# Patient Record
Sex: Male | Born: 1945 | Race: Black or African American | Hispanic: No | Marital: Married | Smoking: Never smoker
Health system: Southern US, Community
[De-identification: ages and names within clinical notes are randomized; demographics above are authoritative.]

## PROBLEM LIST (undated history)

## (undated) DIAGNOSIS — E119 Type 2 diabetes mellitus without complications: Secondary | ICD-10-CM

## (undated) DIAGNOSIS — I10 Essential (primary) hypertension: Secondary | ICD-10-CM

## (undated) HISTORY — PX: TONSILLECTOMY: SUR1361

---

## 2015-02-14 ENCOUNTER — Emergency Department: Payer: Medicare Other

## 2015-02-14 ENCOUNTER — Encounter: Payer: Self-pay | Admitting: *Deleted

## 2015-02-14 ENCOUNTER — Emergency Department
Admission: EM | Admit: 2015-02-14 | Discharge: 2015-02-17 | Disposition: A | Discharge status: Home / Self Care | Payer: Medicare Other | Attending: Student | Admitting: Student

## 2015-02-14 DIAGNOSIS — I10 Essential (primary) hypertension: Secondary | ICD-10-CM | POA: Insufficient documentation

## 2015-02-14 DIAGNOSIS — F0280 Dementia in other diseases classified elsewhere without behavioral disturbance: Secondary | ICD-10-CM

## 2015-02-14 DIAGNOSIS — G309 Alzheimer's disease, unspecified: Secondary | ICD-10-CM

## 2015-02-14 DIAGNOSIS — E1165 Type 2 diabetes mellitus with hyperglycemia: Secondary | ICD-10-CM | POA: Insufficient documentation

## 2015-02-14 DIAGNOSIS — F0391 Unspecified dementia with behavioral disturbance: Secondary | ICD-10-CM | POA: Insufficient documentation

## 2015-02-14 DIAGNOSIS — Z5900 Homelessness unspecified: Secondary | ICD-10-CM

## 2015-02-14 DIAGNOSIS — R339 Retention of urine, unspecified: Secondary | ICD-10-CM | POA: Diagnosis not present

## 2015-02-14 DIAGNOSIS — E119 Type 2 diabetes mellitus without complications: Secondary | ICD-10-CM

## 2015-02-14 DIAGNOSIS — R338 Other retention of urine: Secondary | ICD-10-CM

## 2015-02-14 DIAGNOSIS — R319 Hematuria, unspecified: Secondary | ICD-10-CM | POA: Diagnosis present

## 2015-02-14 HISTORY — DX: Essential (primary) hypertension: I10

## 2015-02-14 HISTORY — DX: Type 2 diabetes mellitus without complications: E11.9

## 2015-02-14 LAB — COMPREHENSIVE METABOLIC PANEL
ALK PHOS: 92 U/L (ref 38–126)
ALT: 25 U/L (ref 17–63)
AST: 23 U/L (ref 15–41)
Albumin: 4.4 g/dL (ref 3.5–5.0)
Anion gap: 12 (ref 5–15)
BUN: 19 mg/dL (ref 6–20)
CHLORIDE: 101 mmol/L (ref 101–111)
CO2: 22 mmol/L (ref 22–32)
Calcium: 9.7 mg/dL (ref 8.9–10.3)
Creatinine, Ser: 1.33 mg/dL — ABNORMAL HIGH (ref 0.61–1.24)
GFR calc Af Amer: >60 mL/min (ref >60)
GFR, EST NON AFRICAN AMERICAN: 53 mL/min — AB (ref >60)
Glucose, Bld: 354 mg/dL — ABNORMAL HIGH (ref 65–99)
POTASSIUM: 3.7 mmol/L (ref 3.5–5.1)
SODIUM: 135 mmol/L (ref 135–145)
Total Bilirubin: 0.8 mg/dL (ref 0.3–1.2)
Total Protein: 8.3 g/dL — ABNORMAL HIGH (ref 6.5–8.1)

## 2015-02-14 LAB — CBC
HCT: 40.5 % (ref 40.0–52.0)
Hemoglobin: 13.3 g/dL (ref 13.0–18.0)
MCH: 29.1 pg (ref 26.0–34.0)
MCHC: 32.9 g/dL (ref 32.0–36.0)
MCV: 88.5 fL (ref 80.0–100.0)
PLATELETS: 237 10*3/uL (ref 150–440)
RBC: 4.57 MIL/uL (ref 4.40–5.90)
RDW: 13.3 % (ref 11.5–14.5)
WBC: 12.2 10*3/uL — ABNORMAL HIGH (ref 3.8–10.6)

## 2015-02-14 LAB — URINALYSIS COMPLETE WITH MICROSCOPIC (ARMC ONLY)
Bacteria, UA: NONE SEEN
Bilirubin Urine: NEGATIVE
Glucose, UA: >500 mg/dL — AB
LEUKOCYTES UA: NEGATIVE
NITRITE: NEGATIVE
PH: 5 (ref 5.0–8.0)
PROTEIN: 30 mg/dL — AB
Specific Gravity, Urine: 1.027 (ref 1.005–1.030)
Squamous Epithelial / LPF: NONE SEEN

## 2015-02-14 LAB — GLUCOSE, CAPILLARY
Glucose-Capillary: 314 mg/dL — ABNORMAL HIGH (ref 70–99)
Glucose-Capillary: 327 mg/dL — ABNORMAL HIGH (ref 70–99)
Glucose-Capillary: 340 mg/dL — ABNORMAL HIGH (ref 70–99)

## 2015-02-14 MED ORDER — METFORMIN HCL 500 MG PO TABS
500.0000 mg | ORAL_TABLET | Freq: Two times a day (BID) | ORAL | Status: DC
  Administered 2015-02-15 – 2015-02-17 (×6): 500 mg via ORAL

## 2015-02-14 MED ORDER — INSULIN ASPART 100 UNIT/ML ~~LOC~~ SOLN
SUBCUTANEOUS | Status: AC
  Administered 2015-02-14: 8 [IU] via SUBCUTANEOUS
  Filled 2015-02-14: qty 8

## 2015-02-14 MED ORDER — LIDOCAINE HCL 2 % EX GEL
1.0000 "application " | Freq: Once | CUTANEOUS | Status: AC
  Administered 2015-02-14: 1 via URETHRAL

## 2015-02-14 MED ORDER — INSULIN ASPART 100 UNIT/ML ~~LOC~~ SOLN
8.0000 [IU] | Freq: Once | SUBCUTANEOUS | Status: AC
  Administered 2015-02-14: 8 [IU] via SUBCUTANEOUS

## 2015-02-14 MED ORDER — METFORMIN HCL 500 MG PO TABS
ORAL_TABLET | ORAL | Status: AC
  Administered 2015-02-14: 1000 mg via ORAL
  Filled 2015-02-14: qty 2

## 2015-02-14 MED ORDER — INSULIN ASPART 100 UNIT/ML ~~LOC~~ SOLN
6.0000 [IU] | Freq: Once | SUBCUTANEOUS | Status: AC
  Administered 2015-02-14: 6 [IU] via SUBCUTANEOUS

## 2015-02-14 MED ORDER — INSULIN ASPART 100 UNIT/ML ~~LOC~~ SOLN
SUBCUTANEOUS | Status: AC
  Administered 2015-02-14: 6 [IU] via SUBCUTANEOUS
  Filled 2015-02-14: qty 6

## 2015-02-14 MED ORDER — METFORMIN HCL 500 MG PO TABS
1000.0000 mg | ORAL_TABLET | Freq: Once | ORAL | Status: AC
  Administered 2015-02-14: 1000 mg via ORAL

## 2015-02-14 MED ORDER — CIPROFLOXACIN HCL 500 MG PO TABS
250.0000 mg | ORAL_TABLET | Freq: Every morning | ORAL | Status: DC
  Administered 2015-02-15 – 2015-02-17 (×3): 250 mg via ORAL

## 2015-02-14 MED ORDER — LIDOCAINE HCL 2 % EX GEL
CUTANEOUS | Status: AC
  Administered 2015-02-14: 1 via URETHRAL
  Filled 2015-02-14: qty 10

## 2015-02-14 NOTE — ED Notes (Signed)
Pt to CT via stretcher

## 2015-02-14 NOTE — ED Notes (Signed)
Returned to room from CT; warm blankets given to pt; foley noted draining clear yellow urine; pt denies any c/o at present; FSBS performed as ordered & pt informed will recheck hourly until stable; pt voices good understanding

## 2015-02-14 NOTE — ED Provider Notes (Signed)
[facility] Emergency Department Provider Note  ____________________________________________  Time seen: Approximately 5:51 PM  I have reviewed the triage vital signs and the nursing notes.   HISTORY  Chief Complaint Hematuria    HPI Brandon Turner is a 69 y.o. male who presents after pulling out his Foley catheter. Per the patient and his sister he had a "swollen" prostate about one week ago and had to have a Foley catheter placed at [facility]. Today the patient pulled his catheter out which caused penile pain and some bleeding which is improving now. However he states he feels the urge to urinate.  Patient also reports that [place] [facility] [place] about one week ago and did not bring any medications with him. He states he has not taken his medicines for diabetes for quite some time, a little difficult to determine exactly how long.  Patient denies fevers or chills. He does feel a urge to urinate in the lower abdomen. She describes it as moderate in intensity. He is on a recent surgeries. He takes no medications, though he is supposed to be on some.  Per the patient's sister, he has been having issues with being homeless and living with his brother who also has some form of dementia. Patient's sister reports the patient is also thought to have some mild dementia.  Family is concerned that the patient is not doing well on his own at present time. (I can certainly share in her sediment at this point.)  History and review of systems is somewhat limited and the patient has poor recollection of events.  Particular the patient is unable to recall any of his medications. He is unable to give me further details of his past medical history.  Past Medical History  Diagnosis Date  . Diabetes mellitus without complication   . Hypertension     There are no active problems to display for this patient.   Past Surgical History  Procedure Laterality  Date  . Tonsillectomy      No current outpatient prescriptions on file.  Allergies Codeine  No family history on file.  Social History History  Substance Use Topics  . Smoking status: Never Smoker   . Smokeless tobacco: Never Used  . Alcohol Use: No    Review of Systems Constitutional: No fever/chills Eyes: No visual changes. ENT: No sore throat. Cardiovascular: Denies chest pain. Respiratory: Denies shortness of breath. Gastrointestinal: No abdominal pain.  No nausea, no vomiting.  No diarrhea.  No constipation. Genitourinary: See history of present illness Musculoskeletal: Negative for back pain. Skin: Negative for rash. Neurological: Negative for headaches, focal weakness or numbness.  10-point ROS otherwise negative.  ____________________________________________   PHYSICAL EXAM:  VITAL SIGNS: ED Triage Vitals  Enc Vitals Group     BP 02/14/15 1518 154/73 mmHg     Pulse Rate 02/14/15 1518 94     Resp --      Temp --      Temp Source 02/14/15 1518 Oral     SpO2 02/14/15 1518 96 %     Weight 02/14/15 1518 260 lb (117.935 kg)     Height 02/14/15 1518 5\' 9"  (1.753 m)     Head Cir --      Peak Flow --      Pain Score 02/14/15 1519 10     Pain Loc --      Pain Edu? --      Excl. in GC? --  Constitutional: Alert and oriented to place, situation, and month. The patient is disoriented to year states that 2012.. Well appearing and in no acute distress. Eyes: Conjunctivae are normal. PERRL. EOMI. Head: Atraumatic. Nose: No congestion/rhinnorhea. Mouth/Throat: Mucous membranes are moist.  Oropharynx non-erythematous. Neck: No stridor.   Cardiovascular: Normal rate, regular rhythm. Grossly normal heart sounds.  Good peripheral circulation. Respiratory: Normal respiratory effort.  No retractions. Lungs CTAB. Gastrointestinal: Soft and nontender. No distention but there is a feel to urinate when palpating suprapubic.Marland Kitchen No abdominal bruits. No CVA  tenderness. Normal appearing uncircumcised penis except for some blood at the urethral meatus. There is no active bleeding. Musculoskeletal: No lower extremity tenderness nor edema.  No joint effusions. Neurologic:  Normal speech and language. No gross focal neurologic deficits are appreciated. Speech is normal. No gait instability. Skin:  Skin is warm, dry and intact. No rash noted. Psychiatric: Mood and affect are normal. Speech and behavior are normal.  ____________________________________________   LABS (all labs ordered are listed, but only abnormal results are displayed)  Labs Reviewed  CBC - Abnormal; Notable for the following:    WBC 12.2 (*)    All other components within normal limits  COMPREHENSIVE METABOLIC PANEL - Abnormal; Notable for the following:    Glucose, Bld 354 (*)    Creatinine, Ser 1.33 (*)    Total Protein 8.3 (*)    GFR calc non Af Amer 53 (*)    All other components within normal limits  URINALYSIS COMPLETEWITH MICROSCOPIC ([facility])  - Abnormal; Notable for the following:    Color, Urine YELLOW (*)    APPearance HAZY (*)    Glucose, UA >500 (*)    Ketones, ur TRACE (*)    Hgb urine dipstick 3+ (*)    Protein, ur 30 (*)    All other components within normal limits  GLUCOSE, CAPILLARY - Abnormal; Notable for the following:    Glucose-Capillary 314 (*)    All other components within normal limits  GLUCOSE, CAPILLARY - Abnormal; Notable for the following:    Glucose-Capillary 340 (*)    All other components within normal limits  CBG MONITORING, ED  CBG MONITORING, ED  CBG MONITORING, ED  CBG MONITORING, ED  CBG MONITORING, ED  CBG MONITORING, ED  CBG MONITORING, ED  CBG MONITORING, ED  CBG MONITORING, ED  CBG MONITORING, ED  CBG MONITORING, ED  CBG MONITORING, ED  CBG MONITORING, ED  CBG MONITORING, ED  CBG MONITORING, ED  CBG MONITORING, ED  CBG MONITORING, ED    ____________________________________________  EKG    ____________________________________________  RADIOLOGY   ____________________________________________   PROCEDURES  Procedure(s) performed: None  Critical Care performed: No  ____________________________________________   INITIAL IMPRESSION / ASSESSMENT AND PLAN / ED COURSE  Pertinent labs & imaging results that were available during my care of the patient were reviewed by me and considered in my medical decision making (see chart for details).  1. Foley catheter traumatically removed by patient. I discussed with the patient that based on the history is given that he likely needs to have a catheter placed for at least another 2 weeks. He is agreeable with this as we discussed that if he removes the catheter and is unable to urinate that he could have kidney failure, high, or end up on dialysis. He is sister agree with this. We will obtain a UA to evaluate for signs of infection. At this point his bladder scan is greater than 200 mL's and he  is unable to urinate suggestive of urinary obstruction likely due to an enlarged prostate based on his history.  2 hyperglycemia. Patient is a diabetic but has been noncompliant with his medications for at least one week. He states that he believes he was on metformin in the past, but this is a little unclear [place]. We will initiate metformin here and give a dose of insulin to improve his diabetes.  3. Disorientation. Per his sister, it appears the patient likely has long-standing dementia and is now homeless. He is noncompliant with his medications and medical care. This prompts concern for the patient's well being, I do not feel he needs to be involuntarily committed to the hospital but psychiatric evaluation and TTS consultation would be helpful. I have attempted to locate his primary care physician in [place] and have paged out at this time unfortunately, there is no on-call physician at  Dr. Sondra Comeruz practice and the patient does not know where he gets his medications or what medications he is supposed to be on. I anticipate clinical social work evaluation would be helpful. Unfortunately at 6 PM it is difficult to obtain this rapidly in the ER.  ----------------------------------------- 7:21 PM on 02/14/2015 -----------------------------------------  Patient catheter placed successfully with good urine output. Await urinalysis. Plan to place the patient on daily Cipro with no evidence of acute UTI.   I discussed with Dr. Toni Amendlapacs, and given the patient's dementia and living situation it makes sense to have social work consult and TTS evaluated as the patient would appear to be at risk living in dependently and/or homeless lady. We will plan to involve Adult Protective Services in his care.   ----------------------------------------- 11:15 PM on 02/14/2015 -----------------------------------------  Care assigned to Dr. Zenda AlpersWebster. We'll continue to manage diabetes in the ER, follow up on glucose after additional insulin. Patient awaiting consultation by social work regarding need for possible placement and evaluation by Adult Management consultantrotective Services.  ____________________________________________   FINAL CLINICAL IMPRESSION(S) / ED DIAGNOSES  Final diagnoses:  None   Acute urinary retention, initial Diabetes, hyperglycemia, medication noncompliance Dementia, chronic  Sharyn CreamerMark Cletus Mehlhoff, MD 02/14/15 2315

## 2015-02-14 NOTE — ED Notes (Signed)
Pt here per [facility] with c/o penile pain and bleeding.  Pt had foley cath in at home and pulled it out because "it hurt".  Pt does not know why he had the foley in place.

## 2015-02-14 NOTE — ED Notes (Signed)
Pt lives at home and states that his Foley was hurting him so he took it out on his own. Pt is bleeding. He has been non-compliant with his medication for the last 5 days. EMS reports that his blood sugar is 348.

## 2015-02-15 DIAGNOSIS — G309 Alzheimer's disease, unspecified: Secondary | ICD-10-CM

## 2015-02-15 DIAGNOSIS — E119 Type 2 diabetes mellitus without complications: Secondary | ICD-10-CM

## 2015-02-15 DIAGNOSIS — Z5900 Homelessness unspecified: Secondary | ICD-10-CM

## 2015-02-15 DIAGNOSIS — F0280 Dementia in other diseases classified elsewhere without behavioral disturbance: Secondary | ICD-10-CM

## 2015-02-15 LAB — GLUCOSE, CAPILLARY
GLUCOSE-CAPILLARY: 320 mg/dL — AB (ref 70–99)
Glucose-Capillary: 280 mg/dL — ABNORMAL HIGH (ref 70–99)
Glucose-Capillary: 294 mg/dL — ABNORMAL HIGH (ref 70–99)
Glucose-Capillary: 307 mg/dL — ABNORMAL HIGH (ref 70–99)
Glucose-Capillary: 369 mg/dL — ABNORMAL HIGH (ref 70–99)

## 2015-02-15 MED ORDER — METFORMIN HCL 500 MG PO TABS: 1000.0000 mg | ORAL_TABLET | Freq: Two times a day (BID) | ORAL | Status: DC

## 2015-02-15 MED ORDER — CIPROFLOXACIN HCL 500 MG PO TABS
ORAL_TABLET | ORAL | Status: AC
  Administered 2015-02-15: 250 mg via ORAL
  Filled 2015-02-15: qty 1

## 2015-02-15 MED ORDER — INSULIN ASPART PROT & ASPART (70-30 MIX) 100 UNIT/ML ~~LOC~~ SUSP
SUBCUTANEOUS | Status: AC
  Administered 2015-02-15: 10 [IU] via SUBCUTANEOUS
  Filled 2015-02-15: qty 100

## 2015-02-15 MED ORDER — ACETAMINOPHEN 500 MG PO TABS
1000.0000 mg | ORAL_TABLET | Freq: Once | ORAL | Status: AC
  Administered 2015-02-15: 1000 mg via ORAL

## 2015-02-15 MED ORDER — METFORMIN HCL 500 MG PO TABS
ORAL_TABLET | ORAL | Status: AC
  Administered 2015-02-15: 500 mg via ORAL
  Filled 2015-02-15: qty 1

## 2015-02-15 MED ORDER — ACETAMINOPHEN 500 MG PO TABS
ORAL_TABLET | ORAL | Status: AC
  Administered 2015-02-15: 1000 mg via ORAL
  Filled 2015-02-15: qty 2

## 2015-02-15 MED ORDER — DONEPEZIL HCL 5 MG PO TABS
5.0000 mg | ORAL_TABLET | Freq: Every day | ORAL | Status: DC
  Administered 2015-02-15 – 2015-02-16 (×2): 5 mg via ORAL

## 2015-02-15 MED ORDER — INSULIN ASPART PROT & ASPART (70-30 MIX) 100 UNIT/ML ~~LOC~~ SUSP
10.0000 [IU] | Freq: Once | SUBCUTANEOUS | Status: AC
  Administered 2015-02-15: 10 [IU] via SUBCUTANEOUS

## 2015-02-15 MED ORDER — DONEPEZIL HCL 5 MG PO TABS
ORAL_TABLET | ORAL | Status: AC
  Filled 2015-02-15: qty 1

## 2015-02-15 MED ORDER — METFORMIN HCL 500 MG PO TABS
ORAL_TABLET | ORAL | Status: AC
  Filled 2015-02-15: qty 1

## 2015-02-15 NOTE — ED Provider Notes (Signed)
-----------------------------------------   7:24 AM on 02/15/2015 -----------------------------------------   BP 140/79 mmHg  Pulse 111  Ht 5\' 9"  (1.753 m)  Wt 260 lb (117.935 kg)  BMI 38.38 kg/m2  SpO2 96%  The patient had no acute events since last update.  He is sleeping currently. A review of his medication shows that he does not have any ongoing diabetes medicine ordered. We will check a fingerstick blood sugar and continue with metformin. I am ordering a single dose of Humulin N 10 units. We will reevaluate and change this to twice a day if this does seems appropriate form. Disposition is pending per Psychiatry/Behavioral Medicine team recommendations.     Darien Ramusavid W Rohen Kimes, MD 02/15/15 [phone]

## 2015-02-15 NOTE — ED Notes (Signed)
Patient up to toilet with one assist for BM. Patient's bed linens changed and patient repositioned after returning to bed.

## 2015-02-15 NOTE — ED Notes (Signed)
Patient's meal tray given. No other needs at this time.

## 2015-02-15 NOTE — ED Notes (Signed)
Provided pt perineal care, pt tolerated well. Provided pt with a [place]key sandwich and some ginger ale.

## 2015-02-15 NOTE — ED Notes (Signed)
BEHAVIORAL HEALTH ROUNDING Patient sleeping: YES Patient alert and oriented: SLEEPING Behavior appropriate: SLEEPING Nutrition and fluids offered: SLEEPING Toileting and hygiene offered: SLEEPING Sitter present: YES Law enforcement present: YES 
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
## 2015-02-15 NOTE — ED Notes (Signed)
pt resting quietly on stretcher in darkened exam room; eyes closed, resp even/unlab, no distress noted; will continue to monitor 

## 2015-02-15 NOTE — ED Notes (Signed)
BEHAVIORAL HEALTH ROUNDING Patient sleeping: NO Patient alert and oriented: YES Behavior appropriate: YES Nutrition and fluids offered: YES Toileting and hygiene offered: YES Sitter present: YES Law enforcement present: YES 

## 2015-02-15 NOTE — ED Notes (Signed)
Awaiting insulin dose from pharmacy d/t out of date bottle in pyxis.

## 2015-02-15 NOTE — ED Notes (Signed)
Psychiatry MD at bedside.

## 2015-02-15 NOTE — ED Notes (Signed)
Patient sleeping soundly

## 2015-02-15 NOTE — ED Notes (Signed)
Patient received phone call from sister. Phone taken to patient. No other needs at this time.

## 2015-02-15 NOTE — ED Notes (Signed)
Dr Zenda AlpersWebster notified of FSBS; no further orders obtained

## 2015-02-15 NOTE — ED Notes (Signed)
CSW at bedside.

## 2015-02-15 NOTE — Consult Note (Signed)
[facility] Consult   Reason for Consult:  Patient with apparent history of dementia brought in by family with acute urinary tract injury. Concern about dementia Referring Physician:  gottlieb Patient Identification: Brandon Turner MRN:  [id number] Principal Diagnosis: Alzheimer's dementia Diagnosis:   Patient Active Problem List   Diagnosis Date Noted  . Alzheimer's dementia [G30.9, F02.80] 02/15/2015  . Homelessness [Z59.0] 02/15/2015  . Diabetes [E11.9] 02/15/2015    Total Time spent with patient: 1 hour  Subjective:   Brandon Turner is a 69 y.o. male patient admitted with "I'm not too good".  HPI:  Patient was brought in by his family after he pulled the Foley catheter out causing an acute injury. He was confused. I've been told that this patient is originally from [facility] but appears to be currently homeless. He had brought himself up. [facility] and was visiting some family and they're concerned that he is not able to take care of himself. Patient says he is aware that he is having problems with his memory.  Give me much more detail. Not a very good historian. He denies feeling depressed. Denies suicidal or homicidal ideation. Denies being aware of any hallucinations. Does feel anxious and nervous. Worries about not having a place to live. Patient says that he has not been taking his medicine for diabetes for quite a while. He thinks this is because he's been too confused in his life's been too much chaos for him to get his medicine. He is aware that not treating his diabetes can lead to death amputation and other severe consequences. Patient is agreeable to the idea that he needs some placement.  Past psychiatric history unclear. He told me at one point that he thinks that he had seen a doctor for depression in the past but at another point that he doesn't think he has ever been treated for mental health problems. No known psychiatric hospitalization. No history  of suicide attempts. Not clear whether he is ever been diagnosed with or treated for depression or dementia.  Patient appears to be homeless. He tells me that he has family but he doesn't stay in very close contact with them. Secondhand through social work I'm told that he just recently came up here from [facility] and his family doesn't even know what his social situation is.  Medical history is that this patient appears to have diabetes which she's not been treating well. Recent urinary tract infection and prostate enlargement. Other medical problems  Substance abuse history is that he denies alcohol or drug abuse currently or in the past  Family history unknown  Current medications unknown HPI Elements:   Quality:  Memory impairment and confusion. Severity:  Moderate but significant. Timing:  Gradually getting worse over months to years. Duration:  Chronic. Context:  Patient currently appears to be homeless with poor ability at self-care.  Past Medical History:  Past Medical History  Diagnosis Date  . Diabetes mellitus without complication   . Hypertension     Past Surgical History  Procedure Laterality Date  . Tonsillectomy     Family History: No family history on file. Social History:  History  Alcohol Use No     History  Drug Use No    History   Social History  . Marital Status: Single    Spouse Name: N/A  . Number of Children: N/A  . Years of Education: N/A   Social History Main Topics  . Smoking status: Never Smoker   .  Smokeless tobacco: Never Used  . Alcohol Use: No  . Drug Use: No  . Sexual Activity: Not on file   Other Topics Concern  . None   Social History Narrative  . None   Additional Social History:                          Allergies:   Allergies  Allergen Reactions  . Codeine Nausea Only    Labs:  Results for orders placed or performed during the hospital encounter of 02/14/15 (from the past 48 hour(s))  Urinalysis  complete, with microscopic [facility])     Status: Abnormal   Collection Time: 02/14/15  2:55 PM  Result Value Ref Range   Color, Urine YELLOW (A) YELLOW   APPearance HAZY (A) CLEAR   Glucose, UA >500 (A) NEGATIVE mg/dL   Bilirubin Urine NEGATIVE NEGATIVE   Ketones, ur TRACE (A) NEGATIVE mg/dL   Specific Gravity, Urine 1.027 1.005 - 1.030   Hgb urine dipstick 3+ (A) NEGATIVE   pH 5.0 5.0 - 8.0   Protein, ur 30 (A) NEGATIVE mg/dL   Nitrite NEGATIVE NEGATIVE   Leukocytes, UA NEGATIVE NEGATIVE   RBC / HPF TOO NUMEROUS TO COUNT 0 - 5 RBC/hpf   WBC, UA 0-5 0 - 5 WBC/hpf   Bacteria, UA NONE SEEN NONE SEEN   Squamous Epithelial / LPF NONE SEEN NONE SEEN   Mucous PRESENT   CBC     Status: Abnormal   Collection Time: 02/14/15  3:18 PM  Result Value Ref Range   WBC 12.2 (H) 3.8 - 10.6 K/uL   RBC 4.57 4.40 - 5.90 MIL/uL   Hemoglobin 13.3 13.0 - 18.0 g/dL   HCT 44.4 99.8 - 21.6 %   MCV 88.5 80.0 - 100.0 fL   MCH 29.1 26.0 - 34.0 pg   MCHC 32.9 32.0 - 36.0 g/dL   RDW 70.2 77.3 - 81.3 %   Platelets 237 150 - 440 K/uL  Comprehensive metabolic panel     Status: Abnormal   Collection Time: 02/14/15  3:18 PM  Result Value Ref Range   Sodium 135 135 - 145 mmol/L   Potassium 3.7 3.5 - 5.1 mmol/L   Chloride 101 101 - 111 mmol/L   CO2 22 22 - 32 mmol/L   Glucose, Bld 354 (H) 65 - 99 mg/dL   BUN 19 6 - 20 mg/dL   Creatinine, Ser 0.87 (H) 0.61 - 1.24 mg/dL   Calcium 9.7 8.9 - 09.7 mg/dL   Total Protein 8.3 (H) 6.5 - 8.1 g/dL   Albumin 4.4 3.5 - 5.0 g/dL   AST 23 15 - 41 U/L   ALT 25 17 - 63 U/L   Alkaline Phosphatase 92 38 - 126 U/L   Total Bilirubin 0.8 0.3 - 1.2 mg/dL   GFR calc non Af Amer 53 (L) >60 mL/min   GFR calc Af Amer >60 >60 mL/min    Comment: (NOTE) The eGFR has been calculated using the CKD EPI equation. This calculation has not been validated in all clinical situations. eGFR's persistently <60 mL/min signify possible Chronic Kidney Disease.    Anion gap 12 5 - 15  Glucose,  capillary     Status: Abnormal   Collection Time: 02/14/15  7:49 PM  Result Value Ref Range   Glucose-Capillary 314 (H) 70 - 99 mg/dL  Glucose, capillary     Status: Abnormal   Collection Time: 02/14/15  9:13 PM  Result Value Ref Range   Glucose-Capillary 340 (H) 70 - 99 mg/dL  Glucose, capillary     Status: Abnormal   Collection Time: 02/14/15 11:42 PM  Result Value Ref Range   Glucose-Capillary 327 (H) 70 - 99 mg/dL  Glucose, capillary     Status: Abnormal   Collection Time: 02/15/15 12:44 AM  Result Value Ref Range   Glucose-Capillary 307 (H) 70 - 99 mg/dL  Glucose, capillary     Status: Abnormal   Collection Time: 02/15/15  7:46 AM  Result Value Ref Range   Glucose-Capillary 294 (H) 70 - 99 mg/dL  Glucose, capillary     Status: Abnormal   Collection Time: 02/15/15  9:57 AM  Result Value Ref Range   Glucose-Capillary 280 (H) 70 - 99 mg/dL  Glucose, capillary     Status: Abnormal   Collection Time: 02/15/15 11:42 AM  Result Value Ref Range   Glucose-Capillary 369 (H) 70 - 99 mg/dL  Glucose, capillary     Status: Abnormal   Collection Time: 02/15/15  5:05 PM  Result Value Ref Range   Glucose-Capillary 320 (H) 70 - 99 mg/dL    Vitals: Blood pressure 130/77, pulse 76, temperature 98.7 F (37.1 C), temperature source Oral, resp. rate 18, height 5\' 9"  (1.753 m), weight 117.935 kg (260 lb), SpO2 95 %.  Risk to Self:   Risk to Others:   Prior Inpatient Therapy:   Prior Outpatient Therapy:    Current Facility-Administered Medications  Medication Dose Route Frequency Provider Last Rate Last Dose  . ciprofloxacin (CIPRO) tablet 250 mg  250 mg Oral q morning - 10a Sharyn CreamerMark Quale, MD   250 mg at 02/15/15 1022  . metFORMIN (GLUCOPHAGE) tablet 500 mg  500 mg Oral BID WC Sharyn CreamerMark Quale, MD   500 mg at 02/15/15 1621   No current outpatient prescriptions on file.    Musculoskeletal: Strength & Muscle Tone: decreased Gait & Station: unsteady Patient leans: N/A  Psychiatric Specialty  Exam: Physical Exam  Constitutional: He appears well-developed and well-nourished.  HENT:  Head: Normocephalic.  Eyes: Pupils are equal, round, and reactive to light.  Neck: Normal range of motion.  Respiratory: Effort normal.  Musculoskeletal: Normal range of motion.  Neurological: He is alert.  Psychiatric: Judgment and thought content normal. His affect is blunt. His speech is delayed. He is slowed and withdrawn. Cognition and memory are impaired. He exhibits abnormal recent memory and abnormal remote memory.    Review of Systems  Constitutional: Negative.   HENT: Negative.   Eyes: Negative.   Respiratory: Negative.   Cardiovascular: Negative.   Gastrointestinal: Negative.   Musculoskeletal: Negative.   Skin: Negative.   Neurological: Negative.   Psychiatric/Behavioral: Negative for depression, suicidal ideas, hallucinations, memory loss and substance abuse. The patient is not nervous/anxious and does not have insomnia.     Blood pressure 130/77, pulse 76, temperature 98.7 F (37.1 C), temperature source Oral, resp. rate 18, height 5\' 9"  (1.753 m), weight 117.935 kg (260 lb), SpO2 95 %.Body mass index is 38.38 kg/(m^2).  General Appearance: Disheveled  Eye SolicitorContact::  Fair  Speech:  Garbled and Slow  Volume:  Decreased  Mood:  Dysphoric  Affect:  Depressed  Thought Process:  Loose  Orientation:  Full (Time, Place, and Person)  Thought Content:  Negative  Suicidal Thoughts:  No  Homicidal Thoughts:  No  Memory:  Immediate;   Fair Recent;   Poor Remote;   Poor  Judgement:  Impaired  Insight:  Present  Psychomotor Activity:  Decreased  Concentration:  Poor  Recall:  Poor  Fund of Knowledge:Poor  Language: Fair  Akathisia:  No  Handed:  Right  AIMS (if indicated):     Assets:  Desire for Improvement  ADL's:  Impaired  Cognition: Impaired,  Moderate and Severe  Sleep:      Medical Decision Making: New problem, with additional work up planned, Review of  Psycho-Social Stressors (1), Review or order clinical lab tests (1) and Discuss test with performing physician (1)  Treatment Plan Summary: Plan Consultation was requested because of apparent dementia. I did a full Mini-Mental status exam. Patient scores a 15 out of 30. Moderate dementia. Clearly unable to take care of himself safely. On the other hand he understands that his medical problems are life threatening and need care. He tells me that he is completely agreeable to the idea of being placed in some kind of facility.As far as capacity I think thathe understands the idea of placement and housing well enough that is not necessary to get guardianship just to get him placed right now. Longer term it would be a good idea probably to consider getting a power of attorney or a guardian as his dementia gets worse. Family should be identified if possible. Meanwhile I suggest starting Aricept at 5 mg a day and will put in an order for it. We'll follow as needed no other specific treatment at this time.   Plan:  Patient does not meet criteria for psychiatric inpatient admission. Discussed crisis plan, support from social network, calling 911, coming to the Emergency Department, and calling Suicide Hotline. Disposition: Start medication. No need to admit to psychiatric hospital. No other specific treatment  Alethia Berthold 02/15/2015 5:12 PM

## 2015-02-16 LAB — GLUCOSE, CAPILLARY
GLUCOSE-CAPILLARY: 310 mg/dL — AB (ref 65–99)
GLUCOSE-CAPILLARY: 428 mg/dL — AB (ref 65–99)
Glucose-Capillary: 303 mg/dL — ABNORMAL HIGH (ref 65–99)
Glucose-Capillary: 358 mg/dL — ABNORMAL HIGH (ref 65–99)
Glucose-Capillary: 282 mg/dL — ABNORMAL HIGH (ref 65–99)

## 2015-02-16 LAB — C DIFFICILE QUICK SCREEN W PCR REFLEX
C DIFFICILE (CDIFF) INTERP: NEGATIVE
C DIFFICILE (CDIFF) TOXIN: NEGATIVE
C Diff antigen: NEGATIVE

## 2015-02-16 MED ORDER — CIPROFLOXACIN HCL 500 MG PO TABS
ORAL_TABLET | ORAL | Status: AC
  Administered 2015-02-16: 250 mg via ORAL
  Filled 2015-02-16: qty 1

## 2015-02-16 MED ORDER — METFORMIN HCL 500 MG PO TABS
ORAL_TABLET | ORAL | Status: AC
  Administered 2015-02-16: 500 mg via ORAL
  Filled 2015-02-16: qty 1

## 2015-02-16 MED ORDER — INSULIN ASPART 100 UNIT/ML ~~LOC~~ SOLN
SUBCUTANEOUS | Status: AC
  Administered 2015-02-16: 11 [IU] via SUBCUTANEOUS
  Filled 2015-02-16: qty 11

## 2015-02-16 MED ORDER — INSULIN GLARGINE 100 UNIT/ML ~~LOC~~ SOLN
12.0000 [IU] | Freq: Every day | SUBCUTANEOUS | Status: DC
  Administered 2015-02-16: 12 [IU] via SUBCUTANEOUS
  Filled 2015-02-16 (×2): qty 0.12

## 2015-02-16 MED ORDER — DONEPEZIL HCL 5 MG PO TABS
ORAL_TABLET | ORAL | Status: AC
  Administered 2015-02-16: 5 mg via ORAL
  Filled 2015-02-16: qty 1

## 2015-02-16 MED ORDER — INSULIN ASPART 100 UNIT/ML ~~LOC~~ SOLN
0.0000 [IU] | Freq: Three times a day (TID) | SUBCUTANEOUS | Status: DC
  Administered 2015-02-16: 11 [IU] via SUBCUTANEOUS
  Administered 2015-02-17: 8 [IU] via SUBCUTANEOUS
  Administered 2015-02-17: 5 [IU] via SUBCUTANEOUS
  Administered 2015-02-17: 8 [IU] via SUBCUTANEOUS

## 2015-02-16 MED FILL — Insulin Aspart Prot & Aspart (Human) Inj 100 Unit/ML (70-30): SUBCUTANEOUS | Qty: 0.1 | Status: AC

## 2015-02-16 NOTE — ED Notes (Signed)
Pt sleeping; no distress noted

## 2015-02-16 NOTE — ED Notes (Signed)
Pt in bed watching TV. NAD noted at this time.

## 2015-02-16 NOTE — ED Notes (Signed)
[facility]  Pt alert.  Skin warm and dry.  No pain.   Pt resting   No acte distress.

## 2015-02-16 NOTE — Progress Notes (Signed)
Inpatient Diabetes Program Recommendations  AACE/ADA: New Consensus Statement on Inpatient Glycemic Control (2013)  Target Ranges:  Prepandial:   less than 140 mg/dL      Peak postprandial:   less than 180 mg/dL (1-2 hours)      Critically ill patients:  140 - 180 mg/dL   Results for Brandon Turner, Brandon Turner (MRN [id number]) as of 02/16/2015 14:05  Ref. Range 02/15/2015 07:46 02/15/2015 09:57 02/15/2015 11:42 02/15/2015 17:05 02/16/2015 08:11  Glucose-Capillary Latest Ref Range: 65-99 mg/dL 784294 (H) 696280 (H) 295369 (H) 320 (H) 303 (H)    Diabetes history: Type 2 Outpatient Diabetes medications: unsure- poor historian Current orders for Inpatient glycemic control: Metformin 500mg  bid  CBG remain elevated despite Metformin 500mg  bid.  Creatinine elevated so I'm reluctant to suggest increasing Metformin.   Consider adding Novolog moderate correction scale tid and hs. (Novolog correction scales can be ordered in order sets - glycemic control order set)    Consider starting Lantus 12 units daily starting now (0.1units/kg)  Susette RacerJulie Alejo Beamer, RN, BA, [place]HA, CDE Diabetes Coordinator Inpatient Diabetes Program  [phone] (Team Pager) [phone] [facility] Office) 02/16/2015 2:15 PM

## 2015-02-16 NOTE — Progress Notes (Addendum)
CSW met with patient and family at bedside to complete this assessment. Patient appears alert, oriented x3, calm, and cooperative. Patient was brought into the ED from family's home because of him taking his foley out at the time having penial bleeding and an unstable blood sugar. Patient reports he would like to be placed in a family care home or assisted living facility. While family was providing details about the patient's wife he would not comment. It is reported that the wife's daughter and son in law are the payee of the patient's social security income and had been paying his bills in the past. The wife is currently with the son in law and daughter's home. CSW spoke with the son in law Lucious [phone] who reports the patient receives about $500 monthly and it is direct deposited into the patient's personal account. The wife's daughter is currently out of the country for work and will return on Monday. He reports that the patient's wife will be placed in a facility by the end of the month but the patient's family must take responsible for his placement.  CSW met with the patient's daughter Benjamin Casanas [phone] who is willing to assist her father with placement. She reports between her and her sister someone will go to DSS to start the medicaid applications. CSW spoke with Ms. Rosana Hoes the [facility] liaison and provided her with the contact to coordinate with the family.  CSW spoke with the nursing staff at Dr. Maryjean Morn office [phone] who will call back with confirmation.  CSW will continue to follow up.

## 2015-02-16 NOTE — ED Notes (Signed)
Received report from Gannett CoSylvia RN. Assumed care of patient.

## 2015-02-16 NOTE — ED Notes (Signed)
Pt called requesting some water, provided pt with a cup of water, no further needs at present.

## 2015-02-16 NOTE — ED Notes (Signed)
Assisted pt to commode.

## 2015-02-16 NOTE — ED Provider Notes (Signed)
-----------------------------------------   6:59 AM on 02/16/2015 -----------------------------------------   BP 134/86 mmHg  Pulse 88  Temp(Src) 99.1 F (37.3 C) (Oral)  Resp 16  Ht 5\' 9"  (1.753 m)  Wt 260 lb (117.935 kg)  BMI 38.38 kg/m2  SpO2 95%  The patient had no acute events since last update.  Calm and cooperative at this time.  Disposition is pending per clinical social work team recommendations.     Irean HongJade J Makayle Krahn, MD 02/16/15 [phone]

## 2015-02-16 NOTE — ED Notes (Signed)
Pt ate dinner tray.  fsbs 310.  md aware.  meds given.  Pt alert.  Watching tv.

## 2015-02-16 NOTE — ED Notes (Signed)
Pt cleaned up and stool sample collected.

## 2015-02-16 NOTE — ED Notes (Signed)
Pt resting at present. No distress noted. Pt lying on bed with eyes closed. Will continue to monitor.

## 2015-02-16 NOTE — ED Notes (Signed)
Call from patient's sister Wende MottLucy King [phone], asked for update on patient. Spoke with her about pt d/c planning and social worker consult. Ms Brooke DareKing is on her way her today to see the patient.

## 2015-02-16 NOTE — Clinical Social Work Note (Signed)
Clinical Social Work Assessment  Patient Details  Name: Brandon Turner MRN: [id number] Date of Birth: 1946/09/24  Date of referral:  02/15/15               Reason for consult:  Abuse/Neglect, Facility Placement                Permission sought to share information with:  PCP, Family Supports Permission granted to share information::  Yes, Verbal Permission Granted  Name::     Lucy(sister), Gerica(daughter)  Agency::  Dr. Maryjean Morn [phone]  Relationship::     Contact Information:     Housing/Transportation Living arrangements for the past 2 months:  [place] Information:  Patient, Adult Children Patient Interpreter Needed:  None Criminal Activity/Legal Involvement Pertinent to Current Situation/Hospitalization:  No - Comment as needed Significant Relationships:    Lives with:    Do you feel safe going back to the place where you live?    Need for family participation in patient care:  Yes (Comment) (Patient has not made rational decision about his medical care as a result he presented with an unstable blood sugar and self ejected foley.)  Care giving concerns:  instability   Social Worker assessment / plan:  CSW met with patient and family at bedside to complete this assessment. Patient appears alert, oriented x3, calm, and cooperative. Patient was brought into the ED from family's home because of him taking his foley out at the time having penial bleeding and an unstable blood sugar. Patient reports he would like to be placed in a family care home or assisted living facility. While family was providing details about the patient's wife he would not comment. It is reported that the wife's daughter and son in law are the payee of the patient's social security income and had been paying his bills in the past. The wife is currently with the son in law and daughter's home. CSW spoke with the son in law Lucious [phone] who reports the patient receives about $500 monthly  and it is direct deposited into the patient's personal account. The wife's daughter is currently out of the country for work and will return on Monday. He reports that the patient's wife will be placed in a facility by the end of the month but the patient's family must take responsible for his placement.  CSW met with the patient's daughter Jere Bostrom [phone] who is willing to assist her father with placement. She reports between her and her sister someone will go to DSS to start the medicaid applications. CSW spoke with Ms. Rosana Hoes the [facility] liaison and provided her with the contact to coordinate with the family.  CSW spoke with the nursing staff at Dr. Maryjean Morn office [phone] who will call back with confirmation.  CSW will continue to follow up.    Employment status:  Retired Forensic scientist:  Medicare PT Recommendations:  No Follow Up Information / Referral to community resources:  APS (Comment Required: [place], Name & Number of worker spoken with), Other (Comment Required) (Family reports APS is involved already and a current need for facility placement.  )  Patient/Family's Response to care: Pt and family agrees with plan  Patient/Family's Understanding of and Emotional Response to Diagnosis, Current Treatment, and Prognosis: Patient and family agrees  Emotional Assessment Appearance:  Appears stated age Attitude/Demeanor/Rapport:    Affect (typically observed):    Orientation:  Oriented to Self, Oriented to Place, Oriented to Situation Alcohol / Substance use:  Not Applicable Psych involvement (Current and /or in the community):     Discharge Needs  Concerns to be addressed:  Cognitive Concerns, Compliance Issues Concerns, Basic Needs, Financial / Insurance Concerns Readmission within the last 30 days:  No Current discharge risk:  Abuse Barriers to Discharge:  Homeless with medical needs, Family Issues, Unsafe home situation   Chesley Noon A,  LCSW 02/16/2015, 9:16 AM

## 2015-02-16 NOTE — ED Notes (Signed)
Provided patient with breakfast tray.

## 2015-02-16 NOTE — ED Notes (Signed)
Pt sleeping no distress noted at present.

## 2015-02-16 NOTE — Care Management Note (Signed)
Case Management Note  Patient Details  Name: Brandon Turner MRN: [id number] Date of Birth: Feb 07, 1946  Subjective/Objective:    [facility] Needs a PCP for meds and follow up, poss. HH  Services.  Called [facility] and have given them my info, they say the earliest appt. Is June 7th.    Worjing withTressa from SW to help pt. With f/u.            Action/Plan: [facility] be [facility] earlier than 1st week of JUne.   Expected Discharge Date:                  Expected Discharge Plan:     In-House Referral:     Discharge planning Services     Post Acute Care Choice:    Choice offered to:     DME Arranged:    DME Agency:     HH Arranged:    HH Agency:     Status of Service:     Medicare Important Message Given:    Date Medicare IM Given:    Medicare IM give by:    Date Additional Medicare IM Given:    Additional Medicare Important Message give by:     If discussed at Long Length of Stay Meetings, dates discussed:    Additional Comments:  Berna BueCheryl Tanay Massiah, RN 02/16/2015, 10:49 AM

## 2015-02-16 NOTE — Consult Note (Signed)
  Psychiatry: Follow-up for this 69 year old man with dementia. Patient has no new complaints. On review of systems he complains of feeling tired and run down. He has been incontinent of stool. On mental status patient is slow and sluggish and confused. Memory poor. No agitation or hostile behavior.  Case reviewed with emergency room doctor and psychiatry staff in the emergency room. Patient needs placement at a longer term facility. If that can't be arranged immediately we can negotiate with family about care at home. Continue current medicine. No other change to plan at this time

## 2015-02-17 LAB — GLUCOSE, CAPILLARY
GLUCOSE-CAPILLARY: 292 mg/dL — AB (ref 65–99)
GLUCOSE-CAPILLARY: 281 mg/dL — AB (ref 65–99)
GLUCOSE-CAPILLARY: 208 mg/dL — AB (ref 65–99)

## 2015-02-17 MED ORDER — INSULIN ASPART 100 UNIT/ML ~~LOC~~ SOLN
SUBCUTANEOUS | Status: AC
  Filled 2015-02-17: qty 5

## 2015-02-17 MED ORDER — INSULIN ASPART 100 UNIT/ML ~~LOC~~ SOLN
SUBCUTANEOUS | Status: AC
  Administered 2015-02-17: 8 [IU] via SUBCUTANEOUS
  Filled 2015-02-17: qty 8

## 2015-02-17 MED ORDER — CIPROFLOXACIN HCL 500 MG PO TABS
ORAL_TABLET | ORAL | Status: AC
  Administered 2015-02-17: 250 mg via ORAL
  Filled 2015-02-17: qty 1

## 2015-02-17 MED ORDER — METFORMIN HCL 500 MG PO TABS
ORAL_TABLET | ORAL | Status: AC
  Administered 2015-02-17: 500 mg via ORAL
  Filled 2015-02-17: qty 1

## 2015-02-17 NOTE — ED Notes (Signed)
Patient resting with eyes closed, lights and tv turned off for patient comfort. Respirations even and unlabored. NAD noted.

## 2015-02-17 NOTE — ED Notes (Signed)
BEHAVIORAL HEALTH ROUNDING Patient sleeping: No. Patient alert and oriented: yes Behavior appropriate: Yes.  ; If no, describe:  Nutrition and fluids offered: Yes  Toileting and hygiene offered: Yes  Sitter present: no Law enforcement present: Yes  

## 2015-02-17 NOTE — ED Provider Notes (Signed)
-----------------------------------------   2:53 AM on 02/17/2015 -----------------------------------------   BP 137/80 mmHg  Pulse 90  Temp(Src) 98.2 F (36.8 C) (Oral)  Resp 20  Ht 5\' 9"  (1.753 m)  Wt 260 lb (117.935 kg)  BMI 38.38 kg/m2  SpO2 98%  The patient had no acute events since last update.  Calm and cooperative at this time.  Disposition is pending per Psychiatry/Behavioral Medicine team recommendations.     Darci Currentandolph N Brown, MD 02/17/15 [phone]

## 2015-02-17 NOTE — ED Notes (Signed)
Pt has medical needs that have been assessed/ plan to discharge to family with medical equipment

## 2015-02-17 NOTE — ED Notes (Signed)
Pt had bloody drainage from tip of penis when ambulating to bathroom.  Foley cath draining yellow urine.  Pt denies any pain.

## 2015-02-17 NOTE — ED Notes (Signed)
Pt's sister driving pt home.  Pt's sister states home health care has been arranged and she has pt's home medications in order.

## 2015-02-17 NOTE — ED Notes (Signed)
ED BHU PLACEMENT JUSTIFICATION Is the patient under IVC or is there intent for IVC: No. Is the patient medically cleared: Yes.   Is there vacancy in the ED BHU: Yes.   Is the population mix appropriate for patient: Yes.   Is the patient awaiting placement in inpatient or outpatient setting: Yes.   Has the patient had a psychiatric consult: Yes.   Survey of unit performed for contraband, proper placement and condition of furniture, tampering with fixtures in bathroom, shower, and each patient room: Yes.  ; Findings:  APPEARANCE/BEHAVIOR calm, cooperative and adequate rapport can be established NEURO ASSESSMENT Orientation: time, place and person Hallucinations: No.None noted (Hallucinations) Speech: Normal Gait: normal RESPIRATORY ASSESSMENT Normal expansion.  Clear to auscultation.  No rales, rhonchi, or wheezing. CARDIOVASCULAR ASSESSMENT regular rate and rhythm, S1, S2 normal, no murmur, click, rub or gallop GASTROINTESTINAL ASSESSMENT soft, nontender, BS WNL, no r/g EXTREMITIES normal strength, tone, and muscle mass PLAN OF CARE Provide calm/safe environment. Vital signs assessed twice daily. ED BHU Assessment once each 12-hour shift. Collaborate with intake RN daily or as condition indicates. Assure the ED provider has rounded once each shift. Provide and encourage hygiene. Provide redirection as needed. Assess for escalating behavior; address immediately and inform ED provider.  Assess family dynamic and appropriateness for visitation as needed: Yes.  ; If necessary, describe findings:  Educate the patient/family about BHU procedures/visitation: Yes.  ; If necessary, describe findings:  

## 2015-02-17 NOTE — ED Provider Notes (Addendum)
-----------------------------------------   5:38 PM on 02/17/2015 -----------------------------------------  BP 137/80 mmHg  Pulse 90  Temp(Src) 98.2 F (36.8 C) (Oral)  Resp 20  Ht 5\' 9"  (1.753 m)  Wt 260 lb (117.935 kg)  BMI 38.38 kg/m2  SpO2 98%  Case management and social work has been working together and with the patient and his family for the appropriate disposition for the patient.  At this time he is ambulatory with his walker but has some chronic deconditioning and generalized weakness.  He seems to be at his new baseline.  He has an indwelling Foley catheter for acute urinary retention.  His ex-wife is offering to take him home and help care for him at her home, but given his deconditioning and generalized weakness, it is appropriate for him to have a hospital bed and her house to allow for easier transfers with diminished risk of falls and sustaining other injuries.  Additionally, his bed needs to be elevated to 30 which a normal but cannot do.  The patient will be discharged with plans to follow up with his outpatient doctors.  A prescription has been written for a hospital bed as per this note.  Please see the social work and case management documentation for additional details.  Judeth CornfieldStephanie with case management is submitting the hospital bed request and the plan is for the patient to be discharged tomorrow to the care of the ex-wife once the hospital bed is available.  ----------------------------------------- 6:37 PM on 02/17/2015 -----------------------------------------  The family now states that they will take him to a motel tonight and that there will be enough family to assist him at that time.  He will then go to his ex-wife's house tomorrow.  Discharging from the emergency department.  The patient is able to ambulate with his walker without difficulty.  Loleta Roseory Caton Popowski, MD 02/17/15 [phone]

## 2015-02-17 NOTE — Care Management Note (Signed)
When I initially spoke to Brandon patient and his sister Brandon plan was for him to be discharged to a boarding [facility] with home health.  I called Brandon Turner Brandon Turner to confirm.  He stated that Brandon application had not been completed, and that he was not willing to allow home health to enter Brandon residence.  Brandon patient does not currently have a PCP therefore I am unable to set up home health. [facility]  They are unable to accept him as a new patient as he has [facility]. Spoke with patient's ex-wife Brandon Turner who is willing for Brandon patient to stay at her residence for 2-3 weeks until other arrangements are made at [facility]isted [facility] pending Medicaid approval.  Brandon Turner states that she physically is unable to assist Brandon patient in and out of Brandon bed due to his physical condition. She requests a hospital bed to be delivered to her home.  [facility]  They [facility] be able to deliver a bed 02/18/15.  [facility] Turner fax demographics, MD order, and MD note to [facility].  Patient's brother Brandon Turner has Brandon Turner home medications and is to bring them to [facility] hospital prior to discharge.

## 2015-02-17 NOTE — Consult Note (Signed)
Psychiatry: Follow-up for this 69 year old man with Alzheimer's dementia. Spoke with the patient and his sister. Patient has no new complaints.  On review of systems he denies any specific pain denies depression denies suicidality.  On mental status disheveled malodorous man calm and cooperative. Good eye contact. Psychomotor activity normal. Speech decreased in amount. Affect euthymic. Thoughts slow disorganized area no apparent responding to internal stimuli. Cognitively quite impaired.  Patient requires close care and eventually probably placement. No further psychiatric intervention at this point. Working on discharge planning for appropriate placement. Psychoeducation with the patient and family done.

## 2015-02-17 NOTE — Discharge Instructions (Signed)
Please follow up at the next available opportunity with Brandon Turner's outpatient physicians.   Dementia Dementia is a general term for problems with brain function. A person with dementia has memory loss and a hard time with at least one other brain function such as thinking, speaking, or problem solving. Dementia can affect social functioning, how you do your job, your mood, or your personality. The changes may be hidden for a long time. The earliest forms of this disease are usually not detected by family or friends. Dementia can be:  Irreversible.  Potentially reversible.  Partially reversible.  Progressive. This means it can get worse over time. CAUSES  Irreversible dementia causes may include:  Degeneration of brain cells (Alzheimer disease or Lewy body dementia).  Multiple small strokes (vascular dementia).  Infection (chronic meningitis or Creutzfeldt-Jakob disease).  Frontotemporal dementia. This affects younger people, age 68 to 1670, compared to those who have Alzheimer disease.  Dementia associated with other disorders like Parkinson disease, Huntington disease, or HIV-associated dementia. Potentially or partially reversible dementia causes may include:  Medicines.  Metabolic causes such as excessive alcohol intake, vitamin B12 deficiency, or thyroid disease.  Masses or pressure in the brain such as a tumor, blood clot, or hydrocephalus. SIGNS AND SYMPTOMS  Symptoms are often hard to detect. Family members or coworkers may not notice them early in the disease process. Different people with dementia may have different symptoms. Symptoms can include:  A hard time with memory, especially recent memory. Long-term memory may not be impaired.  Asking the same question multiple times or forgetting something someone just said.  A hard time speaking your thoughts or finding certain words.  A hard time solving problems or performing familiar tasks (such as how to use a  telephone).  Sudden changes in mood.  Changes in personality, especially increasing moodiness or mistrust.  Depression.  A hard time understanding complex ideas that were never a problem in the past. DIAGNOSIS  There are no specific tests for dementia.   Your health care provider may recommend a thorough evaluation. This is because some forms of dementia can be reversible. The evaluation will likely include a physical exam and getting a detailed history from you and a family member. The history often gives the best clues and suggestions for a diagnosis.  Memory testing may be done. A detailed brain function evaluation called neuropsychologic testing may be helpful.  Lab tests and brain imaging (such as a CT scan or MRI scan) are sometimes important.  Sometimes observation and re-evaluation over time is very helpful. TREATMENT  Treatment depends on the cause.   If the problem is a vitamin deficiency, it may be helped or cured with supplements.  For dementias such as Alzheimer disease, medicines are available to stabilize or slow the course of the disease. There are no cures for this type of dementia.  Your health care provider can help direct you to groups, organizations, and other health care providers to help with decisions in the care of you or your loved one. HOME CARE INSTRUCTIONS The care of individuals with dementia is varied and dependent upon the progression of the dementia. The following suggestions are intended for the person living with, or caring for, the person with dementia.  Create a safe environment.  Remove the locks on bathroom doors to prevent the person from accidentally locking himself or herself in.  Use childproof latches on kitchen cabinets and any place where cleaning supplies, chemicals, or alcohol are kept.  Use  childproof covers in unused electrical outlets.  Install childproof devices to keep doors and windows secured.  Remove stove knobs or  install safety knobs and an automatic shut-off on the stove.  Lower the temperature on water heaters.  Label medicines and keep them locked up.  Secure knives, lighters, matches, power tools, and guns, and keep these items out of reach.  Keep the house free from clutter. Remove rugs or anything that might contribute to a fall.  Remove objects that might break and hurt the person.  Make sure lighting is good, both inside and outside.  Install grab rails as needed.  Use a monitoring device to alert you to falls or other needs for help.  Reduce confusion.  Keep familiar objects and people around.  Use night lights or dim lights at night.  Label items or areas.  Use reminders, notes, or directions for daily activities or tasks.  Keep a simple, consistent routine for waking, meals, bathing, dressing, and bedtime.  Create a calm, quiet environment.  Place large clocks and calendars prominently.  Display emergency numbers and home address near all telephones.  Use cues to establish different times of the day. An example is to open curtains to let the natural light in during the day.   Use effective communication.  Choose simple words and short sentences.  Use a gentle, calm tone of voice.  Be careful not to interrupt.  If the person is struggling to find a word or communicate a thought, try to provide the word or thought.  Ask one question at a time. Allow the person ample time to answer questions. Repeat the question again if the person does not respond.  Reduce nighttime restlessness.  Provide a comfortable bed.  Have a consistent nighttime routine.  Ensure a regular walking or physical activity schedule. Involve the person in daily activities as much as possible.  Limit napping during the day.  Limit caffeine.  Attend social events that stimulate rather than overwhelm the senses.  Encourage good nutrition and hydration.  Reduce distractions during meal  times and snacks.  Avoid foods that are too hot or too cold.  Monitor chewing and swallowing ability.  Continue with routine vision, hearing, dental, and medical screenings.  Give medicines only as directed by the health care provider.  Monitor driving abilities. Do not allow the person to drive when safe driving is no longer possible.  Register with an identification program which could provide location assistance in the event of a missing person situation. SEEK MEDICAL CARE IF:   New behavioral problems start such as moodiness, aggressiveness, or seeing things that are not there (hallucinations).  Any new problem with brain function happens. This includes problems with balance, speech, or falling a lot.  Problems with swallowing develop.  Any symptoms of other illness happen. Small changes or worsening in any aspect of brain function can be a sign that the illness is getting worse. It can also be a sign of another medical illness such as infection. Seeing a health care provider right away is important. SEEK IMMEDIATE MEDICAL CARE IF:   A fever develops.  New or worsened confusion develops.  New or worsened sleepiness develops.  Staying awake becomes hard to do. Document Released: 03/19/2001 Document Revised: 02/07/2014 Document Reviewed: 02/18/2011 [facility] Patient Information 2015 [place], [place]. This information is not intended to replace advice given to you by your health care provider. Make sure you discuss any questions you have with your health care provider.  Diabetes Mellitus and Food It is important for you to manage your blood sugar (glucose) level. Your blood glucose level can be greatly affected by what you eat. Eating healthier foods in the appropriate amounts throughout the day at about the same time each day will help you control your blood glucose level. It can also help slow or prevent worsening of your diabetes mellitus. Healthy eating may even help you  improve the level of your blood pressure and reach or maintain a healthy weight.  HOW CAN FOOD AFFECT ME? Carbohydrates Carbohydrates affect your blood glucose level more than any other type of food. Your dietitian will help you determine how many carbohydrates to eat at each meal and teach you how to count carbohydrates. Counting carbohydrates is important to keep your blood glucose at a healthy level, especially if you are using insulin or taking certain medicines for diabetes mellitus. Alcohol Alcohol can cause sudden decreases in blood glucose (hypoglycemia), especially if you use insulin or take certain medicines for diabetes mellitus. Hypoglycemia can be a life-threatening condition. Symptoms of hypoglycemia (sleepiness, dizziness, and disorientation) are similar to symptoms of having too much alcohol.  If your health care provider has given you approval to drink alcohol, do so in moderation and use the following guidelines:  Women should not have more than one drink per day, and men should not have more than two drinks per day. One drink is equal to:  12 oz of beer.  5 oz of wine.  1 oz of hard liquor.  Do not drink on an empty stomach.  Keep yourself hydrated. Have water, diet soda, or unsweetened iced tea.  Regular soda, juice, and other mixers might contain a lot of carbohydrates and should be counted. WHAT FOODS ARE NOT RECOMMENDED? As you make food choices, it is important to remember that all foods are not the same. Some foods have fewer nutrients per serving than other foods, even though they might have the same number of calories or carbohydrates. It is difficult to get your body what it needs when you eat foods with fewer nutrients. Examples of foods that you should avoid that are high in calories and carbohydrates but low in nutrients include:  Trans fats (most processed foods list trans fats on the Nutrition Facts label).  Regular soda.  Juice.  Candy.  Sweets, such  as cake, pie, doughnuts, and cookies.  Fried foods. WHAT FOODS CAN I EAT? Have nutrient-rich foods, which will nourish your body and keep you healthy. The food you should eat also will depend on several factors, including:  The calories you need.  The medicines you take.  Your weight.  Your blood glucose level.  Your blood pressure level.  Your cholesterol level. You also should eat a variety of foods, including:  Protein, such as meat, poultry, fish, tofu, nuts, and seeds (lean animal proteins are best).  Fruits.  Vegetables.  Dairy products, such as milk, cheese, and yogurt (low fat is best).  Breads, grains, pasta, cereal, rice, and beans.  Fats such as olive oil, trans fat-free margarine, canola oil, avocado, and olives. DOES EVERYONE WITH DIABETES MELLITUS HAVE THE SAME MEAL PLAN? Because every person with diabetes mellitus is different, there is not one meal plan that works for everyone. It is very important that you meet with a dietitian who will help you create a meal plan that is just right for you. Document Released: 06/20/2005 Document Revised: 09/28/2013 Document Reviewed: 08/20/2013 [facility] Patient Information 2015 [place], [place].  This information is not intended to replace advice given to you by your health care provider. Make sure you discuss any questions you have with your health care provider.  Acute Urinary Retention Acute urinary retention is when you are unable to pee (urinate). Acute urinary retention is common in older men. Prostates can get bigger, which blocks the flow of pee.  HOME CARE  Drink enough fluids to keep your pee clear or pale yellow.  If you are sent home with a tube that drains the bladder (catheter), there will be a drainage bag attached to it. There are two types of bags. One is big that you can wear at night without having to empty it. One is smaller and needs to be emptied more often.  Keep the drainage bag empty.  Keep the  drainage bag lower than your catheter.  Only take medicine as told by your doctor. GET HELP IF:  You have a low-grade fever.  You have spasms or you are leaking pee when you have spasms. GET HELP RIGHT AWAY IF:   You have chills or a fever.  Your catheter stops draining pee.  Your catheter falls out.  You have increased bleeding that does not stop after you have rested and increased the amount of fluids you had been drinking. MAKE SURE YOU:   Understand these instructions.  Will watch your condition.  Will get help right away if you are not doing well or get worse. Document Released: 03/11/2008 Document Revised: 07/14/2013 Document Reviewed: 03/04/2013 [facility] Patient Information 2015 [place], [place]. This information is not intended to replace advice given to you by your health care provider. Make sure you discuss any questions you have with your health care provider.  Type 2 Diabetes Mellitus Type 2 diabetes mellitus is a long-term (chronic) disease. In type 2 diabetes:  The pancreas does not make enough of a hormone called insulin.  The cells in the body do not respond as well to the insulin that is made.  Both of the above can happen. Normally, insulin moves sugars from food into tissue cells. This gives you energy. If you have type 2 diabetes, sugars cannot be moved into tissue cells. This causes high blood sugar (hyperglycemia).  HOME CARE  Have your hemoglobin A1c level checked twice a year. The level shows if your diabetes is under control or out of control.  Test your blood sugar level every day as told by your doctor.  Check your ketone levels by testing your pee (urine) when you are sick and as told.  Take your diabetes or insulin medicine as told by your doctor.  Never run out of insulin.  Adjust how much insulin you give yourself based on how many carbs (carbohydrates) you eat. Carbs are in many foods, such as fruits, vegetables, whole grains, and dairy  products.  Have a healthy snack between every healthy meal. Have 3 meals and 3 snacks a day.  Lose weight if you are overweight.  Carry a medical alert card or wear your medical alert jewelry.  Carry a 15-gram carb snack with you at all times. Examples include:  Glucose pills, 3 or 4.  Glucose gel, 15-gram tube.  Raisins, 2 tablespoons (24 grams).  Jelly beans, 6.  Animal crackers, 8.  Regular (not diet) pop, 4 ounces (120 milliliters).  Gummy treats, 9.  Notice low blood sugar (hypoglycemia) symptoms, such as:  Shaking (tremors).  Trouble thinking clearly.  Sweating.  Faster heart rate.  Headache.  Dry  mouth.  Hunger.  Crabbiness (irritability).  Being worried or tense (anxious).  Restless sleep.  A change in speech or coordination.  Confusion.  Treat low blood sugar right away. If you are alert and can swallow, follow the 15:15 rule:  Take 15-20 grams of a rapid-acting glucose or carb. This includes glucose gel, glucose pills, or 4 ounces (120 milliliters) of fruit juice, regular pop, or low-fat milk.  Check your blood sugar level 15 minutes after taking the glucose.  Take 15-20 grams more of glucose if the repeat blood sugar level is still 70 mg/dL (milligrams/deciliter) or below.  Eat a meal or snack within 1 hour of the blood sugar levels going back to normal.  Notice early symptoms of high blood sugar, such as:  Being really thirsty or drinking a lot (polydipsia).  Peeing a lot (polyuria).  Do at least 150 minutes of physical activity a week or as told.  Split the 150 minutes of activity up during the week. Do not do 150 minutes of activity in one day.  Perform exercises, such as weight lifting, at least 2 times a week or as told.  Spend no more than 90 minutes at one time inactive.  Adjust your insulin or food intake as needed if you start a new exercise or sport.  Follow your sick-day plan when you are not able to eat or drink as  usual.  Do not smoke, chew tobacco, or use electronic cigarettes.  Women who are not pregnant should drink no more than 1 drink a day. Men should drink no more than 2 drinks a day.  Only drink alcohol with food.  Ask your doctor if alcohol is safe for you.  Tell your doctor if you drink alcohol several times during the week.  See your doctor regularly.  Schedule an eye exam soon after you are told you have diabetes. Schedule exams once every year.  Check your skin and feet every day. Check for cuts, bruises, redness, nail problems, bleeding, blisters, or sores. A doctor should do a foot exam once a year.  Brush your teeth and gums twice a day. Floss once a day. Visit your dentist regularly.  Share your diabetes plan with your workplace or school.  Stay up-to-date with shots that fight against diseases (immunizations).  Learn how to deal with stress.  Get diabetes education and support as needed.  Ask your doctor for special help if:  You need help to maintain or improve how you do things on your own.  You need help to maintain or improve the quality of your life.  You have foot or hand problems.  You have trouble cleaning yourself, dressing, eating, or doing physical activity. GET HELP IF:  You are unable to eat or drink for more than 6 hours.  You feel sick to your stomach (nauseous) or throw up (vomit) for more than 6 hours.  Your blood sugar level is over 240 mg/dL.  There is a change in mental status.  You get another serious illness.  You have watery poop (diarrhea) for more than 6 hours.  You have been sick or have had a fever for 2 or more days and are not getting better.  You have pain when you are active. GET HELP RIGHT AWAY IF:  You have trouble breathing.  Your ketone levels are higher than your doctor says they should be. MAKE SURE YOU:  Understand these instructions.  Will watch your condition.  Will get help right away  if you are not  doing well or get worse. Document Released: 07/02/2008 Document Revised: 02/07/2014 Document Reviewed: 04/24/2012 [facility] Patient Information 2015 [place], [place]. This information is not intended to replace advice given to you by your health care provider. Make sure you discuss any questions you have with your health care provider.

## 2015-02-17 NOTE — ED Provider Notes (Signed)
-----------------------------------------   7:21 AM on 02/17/2015 -----------------------------------------   BP 137/80 mmHg  Pulse 90  Temp(Src) 98.2 F (36.8 C) (Oral)  Resp 20  Ht 5\' 9"  (1.753 m)  Wt 260 lb (117.935 kg)  BMI 38.38 kg/m2  SpO2 98%  The patient had no acute events since last update.  Calm and cooperative at this time.  Social workers currently working on disposition for the patient. Patient possibly to be sent home with family and home health care.     Minna AntisKevin Dyllan Kats, MD 02/17/15 [phone]

## 2015-02-17 NOTE — ED Notes (Signed)
Patient requesting water, water provided to patient.

## 2015-02-17 NOTE — Care Management Note (Signed)
Was called back to the patient's room by Lucy the patient's sister.  The family is insistent that the patient be discharged tonight.  The families plan is for sister Dianna to pick up the patient tonight from the ER and she will stay with him at a hotel.  Dianna now has all of the patient's home medications.  Tomorrow Bernita BuffyDianna will take the patient to his ex-wife's house [place].  He will stay with her the next 2-3 weeks.  [facility] to [facility] hospital bed to Patricia's address [address] [place] [id number].  Patricia's number is [phone]

## 2015-02-17 NOTE — Progress Notes (Signed)
Inpatient Diabetes Program Recommendations  AACE/ADA: New Consensus Statement on Inpatient Glycemic Control (2013)  Target Ranges:  Prepandial:   less than 140 mg/dL      Peak postprandial:   less than 180 mg/dL (1-2 hours)      Critically ill patients:  140 - 180 mg/dL   Results for Brandon Turner, Brandon Turner (MRN [id number]) as of 02/17/2015 08:16  Ref. Range 02/16/2015 17:17 02/16/2015 18:18 02/16/2015 21:42 02/16/2015 22:37 02/17/2015 07:34  Glucose-Capillary Latest Ref Range: 65-99 mg/dL 782310 (H) 956358 (H) 213428 (H) 282 (H) 292 (H)   Diabetes history: Type 2 Outpatient Diabetes medications: unsure- poor historian, not taking meds prior to admission Current orders for Inpatient glycemic control: Lantus 12 units QHS, Novolog 0-15 units TID, Metformin 500 mg BID  Inpatient Diabetes Program Recommendations Insulin - Basal: Patient's fasting glucose is still elevated at 292 mg/dl. If patient is not discharged today, please consider increasing basal insulin to Lantus 20 units QHS for patient to recieve tonight.  Note: Unsure of home medications. However, if patient is only taking metformin at home. He may benefit by adding basal insulin to the regimen. Patient needs to be able to follow up with PCP in regards to his DM medication and management.  Thanks,  Christena DeemShannon Jamaia Brum RN, MSN, [facility] Inpatient Diabetes Coordinator Team Pager [phone]

## 2015-02-17 NOTE — Progress Notes (Signed)
Received call from DSS worker in [place], [place] [phone].  Stated she is not following case, only got involved when patient's sister came to her office with patient and wife.  [place] [facility]( [place] She provided patient and sister with steps of what they needed to do once [facility] [place] [facility].  States patient's wife is his payee.  CSW in to meet with patient, states he has no place to go, is willing to go to Assisted Living if this is his only option. Informed CSW to call his sister and brother. Call from patient's sister Myriam Jacob[place], [place] she is no longer able to bring [place] to [place] live with her.  Says to call her sister Valentina GuLucy that lives in [place]. [facility] Aundra MilletRose Davis [phone], she has spoken with patient's daughter about starting the long-term care application.  Ms. Earlene PlaterDavis will come meet with patient to start the application and follow up with daughter.    Patient stayed [place] with brother [place] however unable to stay with brother as he is moving and unable to care for patient.   Call to patient's sister Valentina GuLucy , left message to call CSW.   Returned call will come to hospital after lunch to meet with CSW.   CSW will continue to follow and assist with disposition after meeting with sister.   Sammuel Hineseborah Moore. Theresia MajorsLCSWA, MSW Clinical Social Work Department Emergency Room [phone] 11:59 AM

## 2015-03-06 ENCOUNTER — Emergency Department
Admission: EM | Admit: 2015-03-06 | Discharge: 2015-03-06 | Discharge status: Against Medical Advice | Payer: Medicare Other | Attending: Emergency Medicine | Admitting: Emergency Medicine

## 2015-03-06 DIAGNOSIS — I10 Essential (primary) hypertension: Secondary | ICD-10-CM | POA: Insufficient documentation

## 2015-03-06 DIAGNOSIS — M25559 Pain in unspecified hip: Secondary | ICD-10-CM | POA: Insufficient documentation

## 2015-03-06 DIAGNOSIS — E119 Type 2 diabetes mellitus without complications: Secondary | ICD-10-CM | POA: Insufficient documentation

## 2015-03-08 ENCOUNTER — Emergency Department (HOSPITAL_COMMUNITY)
Admission: EM | Admit: 2015-03-08 | Discharge: 2015-03-09 | Disposition: A | Discharge status: Home / Self Care | Payer: Medicare Other | Attending: Emergency Medicine | Admitting: Emergency Medicine

## 2015-03-08 ENCOUNTER — Encounter (HOSPITAL_COMMUNITY): Payer: Self-pay

## 2015-03-08 ENCOUNTER — Emergency Department (HOSPITAL_COMMUNITY): Payer: Medicare Other

## 2015-03-08 DIAGNOSIS — R1030 Lower abdominal pain, unspecified: Secondary | ICD-10-CM | POA: Diagnosis not present

## 2015-03-08 DIAGNOSIS — N289 Disorder of kidney and ureter, unspecified: Secondary | ICD-10-CM

## 2015-03-08 DIAGNOSIS — R339 Retention of urine, unspecified: Secondary | ICD-10-CM | POA: Diagnosis not present

## 2015-03-08 DIAGNOSIS — E119 Type 2 diabetes mellitus without complications: Secondary | ICD-10-CM | POA: Diagnosis not present

## 2015-03-08 DIAGNOSIS — I10 Essential (primary) hypertension: Secondary | ICD-10-CM | POA: Diagnosis not present

## 2015-03-08 DIAGNOSIS — R319 Hematuria, unspecified: Secondary | ICD-10-CM

## 2015-03-08 LAB — BASIC METABOLIC PANEL
ANION GAP: 11 (ref 5–15)
BUN: 27 mg/dL — ABNORMAL HIGH (ref 6–20)
CO2: 26 mmol/L (ref 22–32)
Calcium: 10 mg/dL (ref 8.9–10.3)
Chloride: 99 mmol/L — ABNORMAL LOW (ref 101–111)
Creatinine, Ser: 2.21 mg/dL — ABNORMAL HIGH (ref 0.61–1.24)
GFR calc Af Amer: 33 mL/min — ABNORMAL LOW (ref >60)
GFR calc non Af Amer: 29 mL/min — ABNORMAL LOW (ref >60)
Glucose, Bld: 316 mg/dL — ABNORMAL HIGH (ref 65–99)
Potassium: 4.2 mmol/L (ref 3.5–5.1)
Sodium: 136 mmol/L (ref 135–145)

## 2015-03-08 LAB — CBC WITH DIFFERENTIAL/PLATELET
BASOS ABS: 0 10*3/uL (ref 0.0–0.1)
Basophils Relative: 0 % (ref 0–1)
EOS PCT: 2 % (ref 0–5)
Eosinophils Absolute: 0.2 10*3/uL (ref 0.0–0.7)
HCT: 39.2 % (ref 39.0–52.0)
Hemoglobin: 13 g/dL (ref 13.0–17.0)
LYMPHS ABS: 1.6 10*3/uL (ref 0.7–4.0)
Lymphocytes Relative: 14 % (ref 12–46)
MCH: 29.5 pg (ref 26.0–34.0)
MCHC: 33.2 g/dL (ref 30.0–36.0)
MCV: 89.1 fL (ref 78.0–100.0)
MONO ABS: 0.9 10*3/uL (ref 0.1–1.0)
Monocytes Relative: 8 % (ref 3–12)
Neutro Abs: 8.8 10*3/uL — ABNORMAL HIGH (ref 1.7–7.7)
Neutrophils Relative %: 76 % (ref 43–77)
PLATELETS: 242 10*3/uL (ref 150–400)
RBC: 4.4 MIL/uL (ref 4.22–5.81)
RDW: 12.7 % (ref 11.5–15.5)
WBC: 11.6 10*3/uL — ABNORMAL HIGH (ref 4.0–10.5)

## 2015-03-08 LAB — CBG MONITORING, ED: GLUCOSE-CAPILLARY: 372 mg/dL — AB (ref 65–99)

## 2015-03-08 NOTE — ED Notes (Signed)
 emptied from leg bag.  caught in urinal during bag transition for clean specimen.

## 2015-03-08 NOTE — ED Notes (Signed)
Pt has had a catheter for three weeks for urinary retention, has an appt on the 15th for follow up, today he states he noticed blood in the catheter and the output was low.

## 2015-03-08 NOTE — ED Notes (Signed)
Pt stated that he is not urinating as normal.    Pt stated that he also has no desire to drink.  Pt's family member stated that he had a half glass of water for lunch and a few sips of water this afternoon.

## 2015-03-08 NOTE — ED Notes (Signed)
Foley irrigated with 100cc NS.

## 2015-03-08 NOTE — ED Notes (Signed)
Drainage bag changed at 2040

## 2015-03-09 MED ORDER — CIPROFLOXACIN HCL 500 MG PO TABS: 500.0000 mg | ORAL_TABLET | Freq: Two times a day (BID) | ORAL | Status: DC

## 2015-03-09 NOTE — Discharge Instructions (Signed)
Acute Urinary Retention °Acute urinary retention is the temporary inability to urinate. °This is a common problem in older men. As men age their prostates become larger and block the flow of urine from the bladder. This is usually a problem that has come on gradually.  °HOME CARE INSTRUCTIONS °If you are sent home with a Foley catheter and a drainage system, you will need to discuss the best course of action with your health care provider. While the catheter is in, maintain a good intake of fluids. Keep the drainage bag emptied and lower than your catheter. This is so that contaminated urine will not flow back into your bladder, which could lead to a urinary tract infection. °There are two main types of drainage bags. One is a large bag that usually is used at night. It has a good capacity that will allow you to sleep through the night without having to empty it. The second type is called a leg bag. It has a smaller capacity, so it needs to be emptied more frequently. However, the main advantage is that it can be attached by a leg strap and can go underneath your clothing, allowing you the freedom to move about or leave your home. °Only take over-the-counter or prescription medicines for pain, discomfort, or fever as directed by your health care provider.  °SEEK MEDICAL CARE IF: °· You develop a low-grade fever. °· You experience spasms or leakage of urine with the spasms. °SEEK IMMEDIATE MEDICAL CARE IF:  °· You develop chills or fever. °· Your catheter stops draining urine. °· Your catheter falls out. °· You start to develop increased bleeding that does not respond to rest and increased fluid intake. °MAKE SURE YOU: °· Understand these instructions. °· Will watch your condition. °· Will get help right away if you are not doing well or get worse. °Document Released: 12/30/2000 Document Revised: 09/28/2013 Document Reviewed: 03/04/2013 °ExitCare® Patient Information ©2015 [employer]. This information is not  intended to replace advice given to you by your health care provider. Make sure you discuss any questions you have with your health care provider. ° °

## 2015-03-09 NOTE — ED Provider Notes (Signed)
CSN: [id number]     Arrival date & time 03/08/15  1945 History   First MD Initiated Contact with Patient 03/08/15 2100     Chief Complaint  Patient presents with  . Hematuria     (Consider location/radiation/quality/duration/timing/severity/associated sxs/prior Treatment) HPI Patient has noted increased pressure in his suprapubic area. He believes he seen some blood in his urine. The patient has an indwelling Foley catheter. He says not much urine as come out now for the day. He also has had decreased interest in drinking fluids. No fevers. No flank pain. No vomiting. The patient was seen by his urologist on Monday for bladder spasms and check of the catheter. He denies she's been on any recent antibiotics. Past Medical History  Diagnosis Date  . Diabetes mellitus without complication   . Hypertension    Past Surgical History  Procedure Laterality Date  . Tonsillectomy     History reviewed. No pertinent family history. History  Substance Use Topics  . Smoking status: Never Smoker   . Smokeless tobacco: Never Used  . Alcohol Use: No    Review of Systems 10 Systems reviewed and are negative for acute change except as noted in the HPI.    Allergies  Codeine  Home Medications   Prior to Admission medications   Medication Sig Start Date End Date Taking? Authorizing Provider  ciprofloxacin (CIPRO) 500 MG tablet Take 1 tablet (500 mg total) by mouth 2 (two) times daily. One po bid x 7 days 03/09/15   Arby Barrette, MD   BP 95/58 mmHg  Pulse 74  Temp(Src) 98.5 F (36.9 C) (Oral)  Resp 16  SpO2 95% Physical Exam  Constitutional: He is oriented to person, place, and time. He appears well-developed and well-nourished.  HENT:  Head: Normocephalic and atraumatic.  Eyes: EOM are normal. Pupils are equal, round, and reactive to light.  Neck: Neck supple.  Cardiovascular: Normal rate, regular rhythm, normal heart sounds and intact distal pulses.   Pulmonary/Chest: Effort normal  and breath sounds normal.  Abdominal: Soft. Bowel sounds are normal. He exhibits no distension. There is tenderness.  Suprapubic tenderness to palpation. No guarding or appreciable mass.  Genitourinary:  Foley catheter in place.  Musculoskeletal: Normal range of motion. He exhibits no edema.  Neurological: He is alert and oriented to person, place, and time. He has normal strength. Coordination normal. GCS eye subscore is 4. GCS verbal subscore is 5. GCS motor subscore is 6.  Skin: Skin is warm, dry and intact.  Psychiatric: He has a normal mood and affect.    ED Course  Procedures (including critical care time) Labs Review Labs Reviewed  BASIC METABOLIC PANEL - Abnormal; Notable for the following:    Chloride 99 (*)    Glucose, Bld 316 (*)    BUN 27 (*)    Creatinine, Ser 2.21 (*)    GFR calc non Af Amer 29 (*)    GFR calc Af Amer 33 (*)    All other components within normal limits  CBC WITH DIFFERENTIAL/PLATELET - Abnormal; Notable for the following:    WBC 11.6 (*)    Neutro Abs 8.8 (*)    All other components within normal limits  CBG MONITORING, ED - Abnormal; Notable for the following:    Glucose-Capillary 372 (*)    All other components within normal limits  URINALYSIS, ROUTINE W REFLEX MICROSCOPIC (NOT AT [facility])  CBG MONITORING, ED    Imaging Review Ct Renal Stone Study  03/08/2015  CLINICAL DATA:  Acute onset of urinary retention. Lower abdominal pain. Initial encounter.  EXAM: CT ABDOMEN AND PELVIS WITHOUT CONTRAST  TECHNIQUE: Multidetector CT imaging of the abdomen and pelvis was performed following the standard protocol without IV contrast.  COMPARISON:  None.  FINDINGS: Minimal bibasilar atelectasis is noted. Scattered coronary artery calcifications are seen.  A 1.3 cm hypodensity is noted within the right hepatic lobe. A more vague 1.1 cm hypodensity is noted at the left hepatic lobe. The gallbladder is within normal limits. A 2.8 cm left adrenal mass demonstrates  attenuation compatible with an adrenal adenoma. The right adrenal gland is unremarkable in appearance. The pancreas is within normal limits.  Mild nonspecific perinephric stranding is noted bilaterally. A 1.7 cm cyst is noted at the medial aspect of the right kidney. The kidneys are otherwise unremarkable. There is no evidence of hydronephrosis. No renal or ureteral stones are seen.  No free fluid is identified. The small bowel is unremarkable in appearance. The stomach is within normal limits. No acute vascular abnormalities are seen. Scattered calcification is noted along the abdominal aorta and proximal renal arteries bilaterally.  The appendix is normal in caliber and contains air, without evidence of appendicitis. The colon is unremarkable in appearance.  The bladder is decompressed, with a Foley catheter in place. Mild apparent bladder wall thickening may simply reflect relative decompression, though mild cystitis could have a similar appearance. The prostate is enlarged, with mass effect on the base of the bladder, measuring 5.9 cm in transverse dimension. No inguinal lymphadenopathy is seen.  No acute osseous abnormalities are identified. Anterior osteophytes are seen along the lumbar spine. Chronic irregularity is noted along the superior endplate of L5.  IMPRESSION: 1. Mild apparent bladder wall thickening may simply reflect relative decompression, though mild cystitis could have a similar appearance. 2. Enlarged prostate demonstrates mass effect on the base of the bladder, measuring 5.9 cm in transverse dimension. This could be related to the patient's urinary retention. 3. Nonspecific small hypodensities within the liver, measuring up to 1.3 cm. Would correlate with LFTs, as deemed clinically appropriate. 4. 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 5. Small right renal cyst seen. 6. Scattered coronary artery calcifications noted. 7. Scattered calcification along the abdominal aorta and proximal  renal arteries bilaterally.   Electronically Signed   By: Roanna RaiderJeffery  Chang M.D.   On: 03/08/2015 22:58     EKG Interpretation None      MDM   Final diagnoses:  Hematuria  Urinary retention  Renal insufficiency   Patient is nontoxic with suprapubic discomfort. CT shows bladder wall thickening consistent with possible cystitis. He has no vomiting. He is taking oral intake in the emergency department without difficulty. This point that is safe for discharge with follow-up with his urologist and family physician.    Arby BarretteMarcy Viveka Wilmeth, MD 03/09/15 [phone]

## 2015-07-23 ENCOUNTER — Emergency Department
Admission: EM | Admit: 2015-07-23 | Discharge: 2015-07-23 | Disposition: A | Discharge status: Home / Self Care | Payer: Medicare Other | Attending: Emergency Medicine | Admitting: Emergency Medicine

## 2015-07-23 ENCOUNTER — Encounter: Payer: Self-pay | Admitting: Emergency Medicine

## 2015-07-23 DIAGNOSIS — F0390 Unspecified dementia without behavioral disturbance: Secondary | ICD-10-CM | POA: Diagnosis not present

## 2015-07-23 DIAGNOSIS — E1169 Type 2 diabetes mellitus with other specified complication: Secondary | ICD-10-CM | POA: Insufficient documentation

## 2015-07-23 DIAGNOSIS — I10 Essential (primary) hypertension: Secondary | ICD-10-CM | POA: Insufficient documentation

## 2015-07-23 DIAGNOSIS — E138 Other specified diabetes mellitus with unspecified complications: Secondary | ICD-10-CM

## 2015-07-23 DIAGNOSIS — Z760 Encounter for issue of repeat prescription: Secondary | ICD-10-CM | POA: Diagnosis present

## 2015-07-23 LAB — GLUCOSE, CAPILLARY: Glucose-Capillary: 109 mg/dL — ABNORMAL HIGH (ref 65–99)

## 2015-07-23 MED ORDER — GLUCOSE BLOOD VI STRP: ORAL_STRIP | Status: AC

## 2015-07-23 MED ORDER — GLUCOSE BLOOD VI STRP: ORAL_STRIP | Status: DC

## 2015-07-23 MED ORDER — ACCU-CHEK SOFT TOUCH LANCETS MISC: Status: DC

## 2015-07-23 MED ORDER — ACCU-CHEK SOFT TOUCH LANCETS MISC: Status: AC

## 2015-07-23 MED ORDER — CIPROFLOXACIN HCL 500 MG PO TABS: 500.0000 mg | ORAL_TABLET | Freq: Two times a day (BID) | ORAL | Status: AC

## 2015-07-23 NOTE — Discharge Instructions (Signed)
Please seek medical attention for any high fevers, chest pain, shortness of breath, change in behavior, persistent vomiting, bloody stool or any other new or concerning symptoms.   Blood Glucose Monitoring, Adult Monitoring your blood glucose (also know as blood sugar) helps you to manage your diabetes. It also helps you and your health care provider monitor your diabetes and determine how well your treatment plan is working. WHY SHOULD YOU MONITOR YOUR BLOOD GLUCOSE?  It can help you understand how food, exercise, and medicine affect your blood glucose.  It allows you to know what your blood glucose is at any given moment. You can quickly tell if you are having low blood glucose (hypoglycemia) or high blood glucose (hyperglycemia).  It can help you and your health care provider know how to adjust your medicines.  It can help you understand how to manage an illness or adjust medicine for exercise. WHEN SHOULD YOU TEST? Your health care provider will help you decide how often you should check your blood glucose. This may depend on the type of diabetes you have, your diabetes control, or the types of medicines you are taking. Be sure to write down all of your blood glucose readings so that this information can be reviewed with your health care provider. See below for examples of testing times that your health care provider may suggest. Type 1 Diabetes  Test at least 2 times per day if your diabetes is well controlled, if you are using an insulin pump, or if you perform multiple daily injections.  If your diabetes is not well controlled or if you are sick, you may need to test more often.  It is a good idea to also test:  Before every insulin injection.  Before and after exercise.  Between meals and 2 hours after a meal.  Occasionally between 2:00 a.m. and 3:00 a.m. Type 2 Diabetes  If you are taking insulin, test at least 2 times per day. However, it is best to test before every  insulin injection.  If you take medicines by mouth (orally), test 2 times a day.  If you are on a controlled diet, test once a day.  If your diabetes is not well controlled or if you are sick, you may need to monitor more often. HOW TO MONITOR YOUR BLOOD GLUCOSE Supplies Needed  Blood glucose meter.  Test strips for your meter. Each meter has its own strips. You must use the strips that go with your own meter.  A pricking needle (lancet).  A device that holds the lancet (lancing device).  A journal or log book to write down your results. Procedure  Wash your hands with soap and water. Alcohol is not preferred.  Prick the side of your finger (not the tip) with the lancet.  Gently milk the finger until a small drop of blood appears.  Follow the instructions that come with your meter for inserting the test strip, applying blood to the strip, and using your blood glucose meter. Other Areas to Get Blood for Testing Some meters allow you to use other areas of your body (other than your finger) to test your blood. These areas are called alternative sites. The most common alternative sites are:  The forearm.  The thigh.  The back area of the lower leg.  The palm of the hand. The blood flow in these areas is slower. Therefore, the blood glucose values you get may be delayed, and the numbers are different from what you would  get from your fingers. Do not use alternative sites if you think you are having hypoglycemia. Your reading will not be accurate. Always use a finger if you are having hypoglycemia. Also, if you cannot feel your lows (hypoglycemia unawareness), always use your fingers for your blood glucose checks. ADDITIONAL TIPS FOR GLUCOSE MONITORING  Do not reuse lancets.  Always carry your supplies with you.  All blood glucose meters have a 24-hour "hotline" number to call if you have questions or need help.  Adjust (calibrate) your blood glucose meter with a control  solution after finishing a few boxes of strips. BLOOD GLUCOSE RECORD KEEPING It is a good idea to keep a daily record or log of your blood glucose readings. Most glucose meters, if not all, keep your glucose records stored in the meter. Some meters come with the ability to download your records to your home computer. Keeping a record of your blood glucose readings is especially helpful if you are wanting to look for patterns. Make notes to go along with the blood glucose readings because you might forget what happened at that exact time. Keeping good records helps you and your health care provider to work together to achieve good diabetes management.    This information is not intended to replace advice given to you by your health care provider. Make sure you discuss any questions you have with your health care provider.   Document Released: 09/26/2003 Document Revised: 10/14/2014 Document Reviewed: 02/15/2013 Elsevier Interactive Patient Education [employer]2016 Elsevier Inc.

## 2015-07-23 NOTE — ED Notes (Signed)
Patient and family were educated on how to check CBG and when to give each insulin medication.  Patient and family verbalized understanding and repeated back the steps for how to check sugars and administer sub-q injections for insulin.

## 2015-07-23 NOTE — ED Notes (Signed)
Per pt sister, she took the pt out of a care facility in [place] yesterday because there were ants in the pt room on his pillow and asked staff to do something and states they did not so she took him from there and to the local [facility] and did not refrigerate his insulin and did not know how to administer the medications and wants him checked out.

## 2015-07-23 NOTE — ED Provider Notes (Signed)
[facility] Emergency Department Provider Note  ____________________________________________  Time seen: 1125  I have reviewed the triage vital signs and the nursing notes.   HISTORY  Chief Complaint Medication Refill and Referral   History limited by: some history obtained from sister   HPI Brandon Turner is a 69 y.o. male who is brought to the emergency department today by his sister because of concerns for glucose levels. The sister states that she took her brother out of a group home yesterday because of concerns for an ant infestation. The brother does have a history of diabetes. The sister does not know how to check his glucose levels nor how to administer the insulin. She was concerned that his glucose levels might be off. The patient himself has no complaints. He denies any nausea, abdominal pain.     Past Medical History  Diagnosis Date  . Diabetes mellitus without complication (HCC)   . Hypertension     Patient Active Problem List   Diagnosis Date Noted  . Alzheimer's dementia 02/15/2015  . Homelessness 02/15/2015  . Diabetes (HCC) 02/15/2015    Past Surgical History  Procedure Laterality Date  . Tonsillectomy      Current Outpatient Rx  Name  Route  Sig  Dispense  Refill  . ciprofloxacin (CIPRO) 500 MG tablet   Oral   Take 1 tablet (500 mg total) by mouth 2 (two) times daily. One po bid x 7 days   14 tablet   0     Allergies Codeine  No family history on file.  Social History Social History  Substance Use Topics  . Smoking status: Never Smoker   . Smokeless tobacco: Never Used  . Alcohol Use: No    Review of Systems  Constitutional: Negative for fever. Cardiovascular: Negative for chest pain. Respiratory: Negative for shortness of breath. Gastrointestinal: Negative for abdominal pain, vomiting and diarrhea. Genitourinary: Negative for dysuria. Musculoskeletal: Negative for back pain. Skin: Negative for  rash. Neurological: Negative for headaches, focal weakness or numbness.   10-point ROS otherwise negative.  ____________________________________________   PHYSICAL EXAM:  VITAL SIGNS: ED Triage Vitals  Enc Vitals Group     BP 07/23/15 1046 131/82 mmHg     Pulse Rate 07/23/15 1046 106     Resp 07/23/15 1046 20     Temp 07/23/15 1046 98.2 F (36.8 C)     Temp Source 07/23/15 1046 Oral     SpO2 07/23/15 1046 97 %     Weight 07/23/15 1046 187 lb (84.823 kg)     Height 07/23/15 1046 5\' 9"  (1.753 m)   Constitutional: Alert and oriented. Well appearing and in no distress. Eyes: Conjunctivae are normal. PERRL. Normal extraocular movements. ENT   Head: Normocephalic and atraumatic.   Nose: No congestion/rhinnorhea.   Mouth/Throat: Mucous membranes are moist.   Neck: No stridor. Hematological/Lymphatic/Immunilogical: No cervical lymphadenopathy. Cardiovascular: Normal rate, regular rhythm.  No murmurs, rubs, or gallops. Respiratory: Normal respiratory effort without tachypnea nor retractions. Breath sounds are clear and equal bilaterally. No wheezes/rales/rhonchi. Gastrointestinal: Soft and nontender. No distention. There is no CVA tenderness. Genitourinary: Deferred Musculoskeletal: Normal range of motion in all extremities. No joint effusions.  No lower extremity tenderness nor edema. Neurologic:  Normal speech and language. No gross focal neurologic deficits are appreciated. Speech is normal.  Skin:  Skin is warm, dry and intact. No rash noted. Psychiatric: Mood and affect are normal. Speech and behavior are normal. Patient exhibits appropriate insight and judgment.  ____________________________________________    LABS (pertinent positives/negatives)  Labs Reviewed  GLUCOSE, CAPILLARY - Abnormal; Notable for the following:    Glucose-Capillary 109 (*)    All other components within normal limits      ____________________________________________   EKG  None  ____________________________________________    RADIOLOGY  None   ____________________________________________   PROCEDURES  Procedure(s) performed: None  Critical Care performed: No  ____________________________________________   INITIAL IMPRESSION / ASSESSMENT AND PLAN / ED COURSE  Pertinent labs & imaging results that were available during my care of the patient were reviewed by me and considered in my medical decision making (see chart for details).  Patient brought into the emergency department by his sister today after being taken out of the living facility where he was staying. She was concerned for his glucose levels. Blood glucose today was normal. Did have the nurse show the patient sister how to operate the glucometer. Sister thinks that she will be able to get him into [facility] tomorrow. The patient does not have any complaints. Physical exam is benign.  ____________________________________________   FINAL CLINICAL IMPRESSION(S) / ED DIAGNOSES  Final diagnoses:  Diabetes mellitus of other type with complication (HCC)  Dementia, without behavioral disturbance     Phineas Semen, MD 07/23/15 1228

## 2015-07-24 ENCOUNTER — Emergency Department
Admission: EM | Admit: 2015-07-24 | Discharge: 2015-07-24 | Disposition: A | Discharge status: Home / Self Care | Payer: Medicare Other | Attending: Emergency Medicine | Admitting: Emergency Medicine

## 2015-07-24 ENCOUNTER — Encounter: Payer: Self-pay | Admitting: Emergency Medicine

## 2015-07-24 DIAGNOSIS — K6289 Other specified diseases of anus and rectum: Secondary | ICD-10-CM | POA: Diagnosis not present

## 2015-07-24 DIAGNOSIS — Z792 Long term (current) use of antibiotics: Secondary | ICD-10-CM | POA: Diagnosis not present

## 2015-07-24 DIAGNOSIS — R197 Diarrhea, unspecified: Secondary | ICD-10-CM | POA: Diagnosis present

## 2015-07-24 DIAGNOSIS — E119 Type 2 diabetes mellitus without complications: Secondary | ICD-10-CM | POA: Diagnosis not present

## 2015-07-24 DIAGNOSIS — I10 Essential (primary) hypertension: Secondary | ICD-10-CM | POA: Insufficient documentation

## 2015-07-24 DIAGNOSIS — Z79899 Other long term (current) drug therapy: Secondary | ICD-10-CM | POA: Diagnosis not present

## 2015-07-24 NOTE — ED Notes (Signed)
Demonstrated use of pt personal glucometer to the pt sister at the bedside. Also, reinforced the importance of pt following schedule of insulin as prescribed and taking oral medications

## 2015-07-24 NOTE — ED Notes (Addendum)
Pt presents to ED with diarrhea and rectal discomfort that started this afternoon around 1430. States he was seen by Dr. Derrill KayGoodman yesterday and dx with UTI and has only taken one dose. Denies abd pain. Foley catheter in place and pt states the bag "burst" today. Foley catheter bag changed during triage.

## 2015-07-24 NOTE — ED Notes (Signed)
Demonstrated to family how to draw up insulin from vial and how to measure the units on the syringe. Family verbalizes understanding at this time. Family also given a handout with step by step instructions on how to administer insulin.

## 2015-07-24 NOTE — ED Provider Notes (Addendum)
[facility] Emergency Department Provider Note  ____________________________________________  Time seen: 9:30 PM  I have reviewed the triage vital signs and the nursing notes.   HISTORY  Chief Complaint Diarrhea  history obtained from both patient and sister   HPI Brandon Turner is a 69 y.o. male who complains of loose bowel movements today. He's had 2 or 3 bowel movements today which initially today were performed but small, and over the day progressed to be runny. No blood or black. He does complain of some anal pain. He has a history of enlarged prostate and is planned for a biopsy at his urologist's office in about a week. He was started on antibiotic for UTI yesterday. Sr. recently pulled the patient out of his nursing facility be due to poor sanitary conditions. She is currently seeking placement in another facility which she has handling at home. Patient denies fever chills chest pain shortness of breath or syncope. He is eating including bacon and eggs and pizza today.     Past Medical History  Diagnosis Date  . Diabetes mellitus without complication (HCC)   . Hypertension      Patient Active Problem List   Diagnosis Date Noted  . Alzheimer's dementia 02/15/2015  . Homelessness 02/15/2015  . Diabetes (HCC) 02/15/2015     Past Surgical History  Procedure Laterality Date  . Tonsillectomy       Current Outpatient Rx  Name  Route  Sig  Dispense  Refill  . ciprofloxacin (CIPRO) 500 MG tablet   Oral   Take 1 tablet (500 mg total) by mouth 2 (two) times daily. One po bid x 7 days   14 tablet   0   . glucose blood (BL TEST STRIP PACK) test strip      Use as instructed   100 each   12   . Lancets (ACCU-CHEK SOFT TOUCH) lancets      Use as instructed   100 each   12      Allergies Codeine   No family history on file.  Social History Social History  Substance Use Topics  . Smoking status: Never Smoker   . Smokeless tobacco:  Never Used  . Alcohol Use: No    Review of Systems  Constitutional:   No fever or chills. No weight changes Eyes:   No blurry vision or double vision.  ENT:   No sore throat. Cardiovascular:   No chest pain. Respiratory:   No dyspnea or cough. Gastrointestinal:   Negative for abdominal pain, vomiting and diarrhea.  No BRBPR or melena. Loose bowel movements today Genitourinary:   Negative for dysuria, urinary retention, bloody urine, or difficulty urinating. Musculoskeletal:   Negative for back pain. No joint swelling or pain. Skin:   Negative for rash. Neurological:   Negative for headaches, focal weakness or numbness. Psychiatric:  No anxiety or depression.   Endocrine:  No hot/cold intolerance, changes in energy, or sleep difficulty.  10-point ROS otherwise negative.  ____________________________________________   PHYSICAL EXAM:  VITAL SIGNS: ED Triage Vitals  Enc Vitals Group     BP 07/24/15 1932 128/77 mmHg     Pulse Rate 07/24/15 1932 106     Resp 07/24/15 1932 18     Temp 07/24/15 1932 98.2 F (36.8 C)     Temp Source 07/24/15 1932 Oral     SpO2 07/24/15 1932 99 %     Weight 07/24/15 1932 187 lb (84.823 kg)     Height  07/24/15 1932 5\' 9"  (1.753 m)     Head Cir --      Peak Flow --      Pain Score 07/24/15 1933 8     Pain Loc --      Pain Edu? --      Excl. in GC? --      Constitutional:   Alert and oriented to person and place. Well appearing and in no distress. Eyes:   No scleral icterus. No conjunctival pallor. PERRL. EOMI ENT   Head:   Normocephalic and atraumatic.   Nose:   No congestion/rhinnorhea. No septal hematoma   Mouth/Throat:   MMM, no pharyngeal erythema. No peritonsillar mass. No uvula shift.   Neck:   No stridor. No SubQ emphysema. No meningismus. Hematological/Lymphatic/Immunilogical:   No cervical lymphadenopathy. Cardiovascular:   RRR. Normal and symmetric distal pulses are present in all extremities. No murmurs, rubs, or  gallops. Respiratory:   Normal respiratory effort without tachypnea nor retractions. Breath sounds are clear and equal bilaterally. No wheezes/rales/rhonchi. Gastrointestinal:   Soft and nontender. No distention. There is no CVA tenderness.  No rebound, rigidity, or guarding. Anus and gluteal cleft unremarkable. Perineum unremarkable Genitourinary:   Foley catheter in place, no lesions. Draining clear urine Musculoskeletal:   Nontender with normal range of motion in all extremities. No joint effusions.  No lower extremity tenderness.  No edema. Neurologic:   Normal speech and language.  CN 2-10 normal. Motor grossly intact. No pronator drift.  Normal gait. No gross focal neurologic deficits are appreciated.  Skin:    Skin is warm, dry and intact. No rash noted.  No petechiae, purpura, or bullae. Psychiatric:   Mood and affect are normal. Speech and behavior are normal. Patient exhibits appropriate insight and judgment.  ____________________________________________    LABS (pertinent positives/negatives) (all labs ordered are listed, but only abnormal results are displayed) Labs Reviewed - No data to display ____________________________________________   EKG    ____________________________________________    RADIOLOGY    ____________________________________________   PROCEDURES   ____________________________________________   INITIAL IMPRESSION / ASSESSMENT AND PLAN / ED COURSE  Pertinent labs & imaging results that were available during my care of the patient were reviewed by me and considered in my medical decision making (see chart for details).  Patient presents with loose bowel movement in the setting of recently starting antibiotics. Overall very well-appearing normal vital signs exam reassuring and benign. Sister again counseled on how to use glucometer for fingersticks. Will follow up with primary care and says she'll continue seeking new nursing home placement.  The patient is medically stable and suitable for outpatient follow-up and discharge home. Likely has either antibiotic associated loose bowel movements which may worsen, or a viral illness that he has picked up from his previous nursing home. Low suspicion for mesenteric ischemia, bowel obstruction or perforation, biliary pathology, or AAA     ____________________________________________   FINAL CLINICAL IMPRESSION(S) / ED DIAGNOSES  Final diagnoses:  Anal pain   loose bowel movement    Sharman CheekPhillip Dionel Archey, MD 07/24/15 2212  Sharman CheekPhillip Akari Crysler, MD 07/24/15 2212

## 2015-08-08 DEATH — deceased

## 2017-05-29 IMAGING — CT CT RENAL STONE PROTOCOL
2 of 3 series · 16 of 46 positions shown, 18 images · non-contrast
Comparison: None.

CLINICAL DATA: Acute onset of urinary retention. Lower abdominal
pain. Initial encounter.

EXAM:
CT ABDOMEN AND PELVIS WITHOUT CONTRAST
TECHNIQUE: Multidetector CT imaging of the abdomen and pelvis was performed
following the standard protocol without IV contrast.

[Series 3: coronal · coronal · 0.91mm/px · 3 of 115 slices shown]
[im 39/115  soft-tissue]
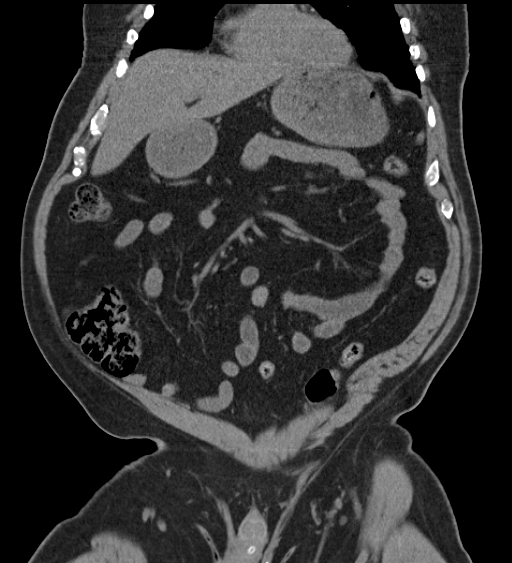
[im 51/115  soft-tissue]
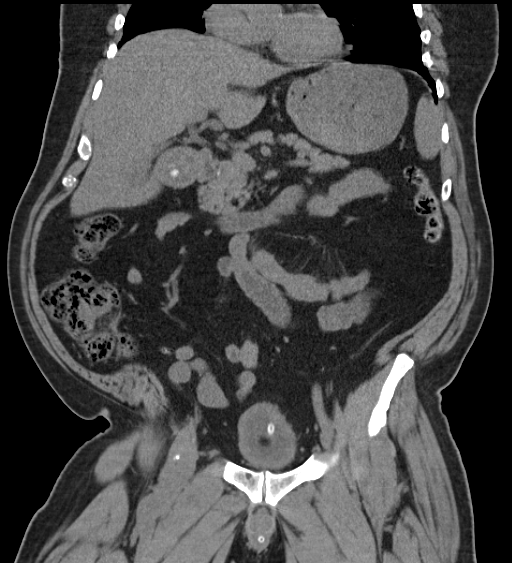
[im 64/115  soft-tissue]
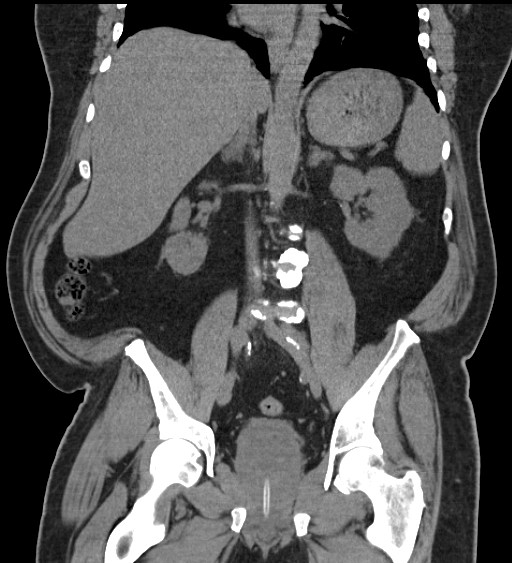

[Series 5: lung · axial · 0.90mm/px · z∈[-138,-3]mm · 13 of 33 slices shown, 15 images]
[im 3/33  soft-tissue]
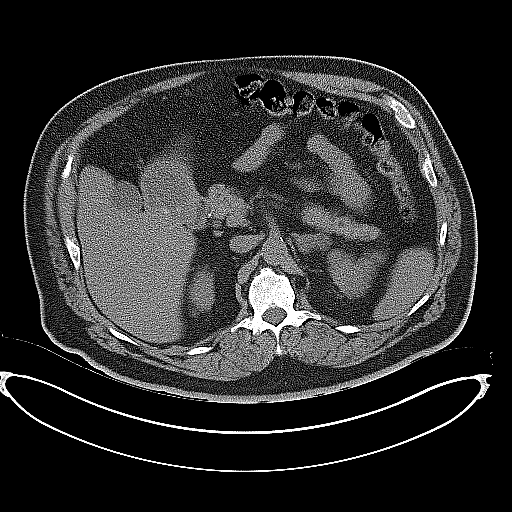
[im 3/33  bone]
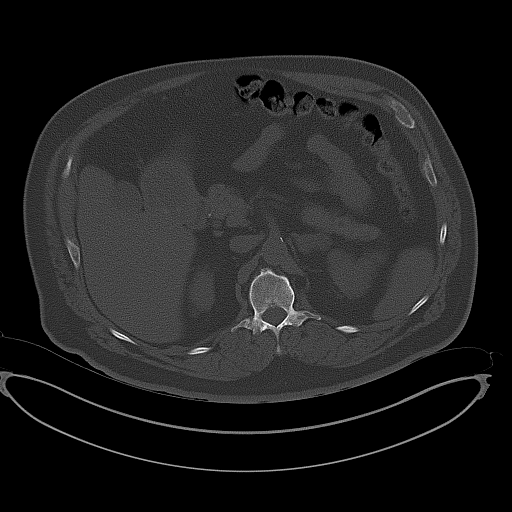
[im 5/33  soft-tissue]
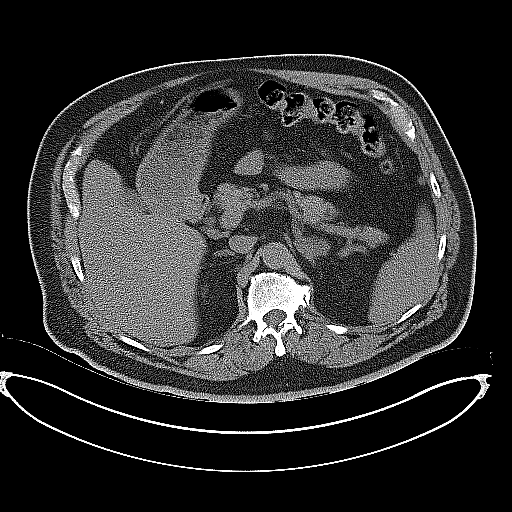
[im 7/33  soft-tissue]
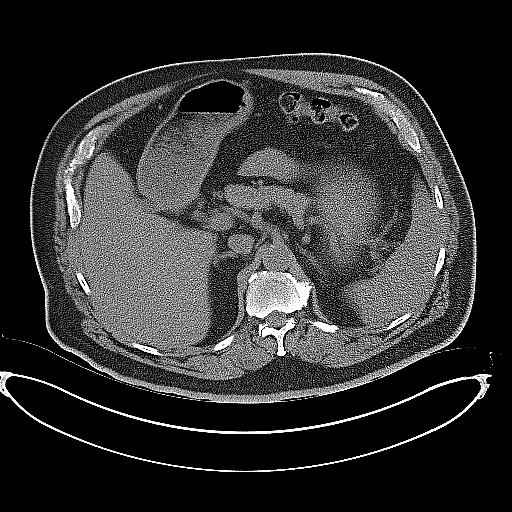
[im 10/33  soft-tissue]
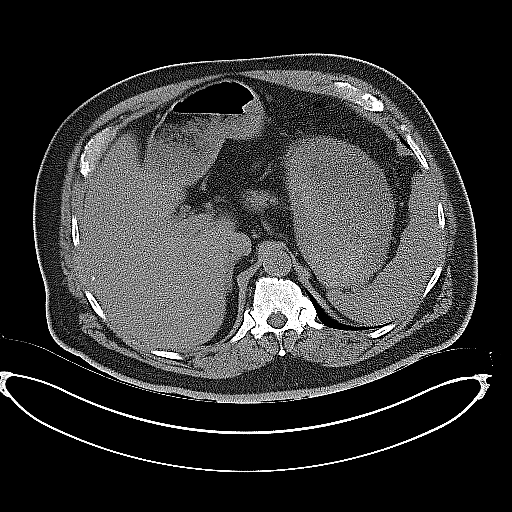
[im 12/33  soft-tissue]
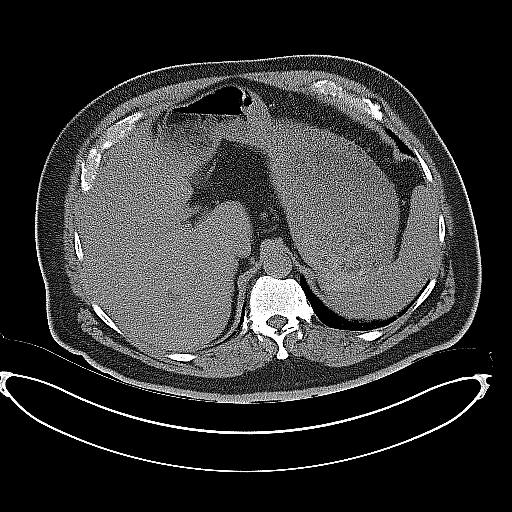
[im 14/33  soft-tissue]
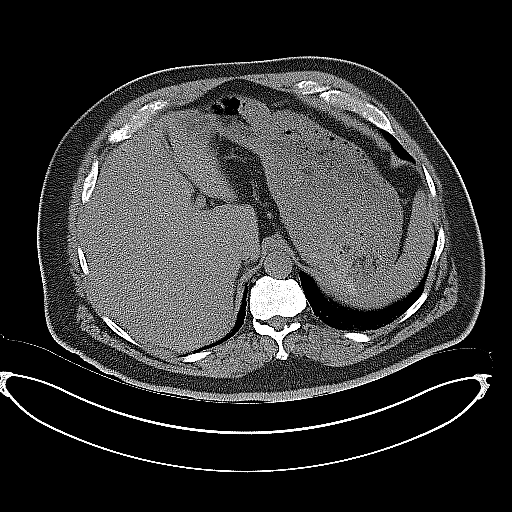
[im 17/33  soft-tissue]
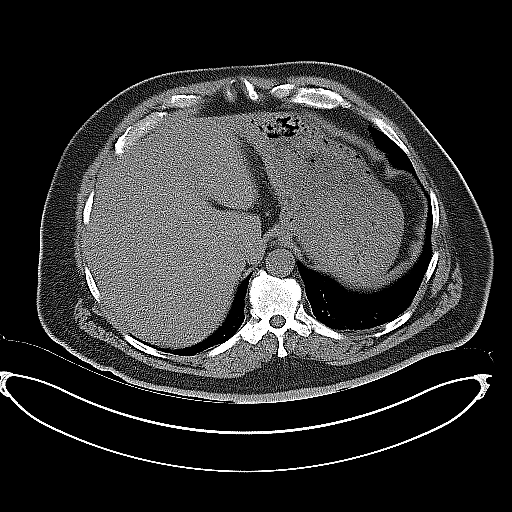
[im 19/33  soft-tissue]
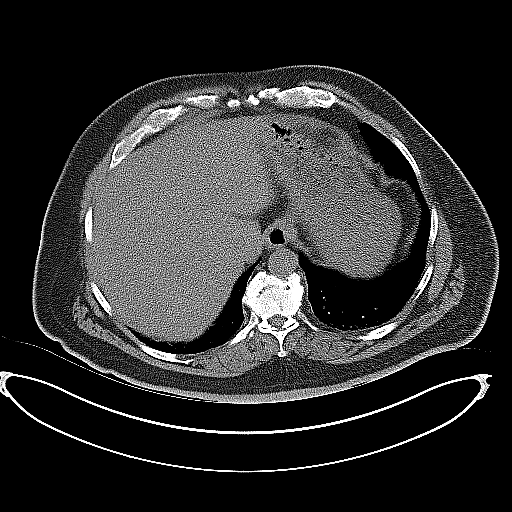
[im 21/33  soft-tissue]
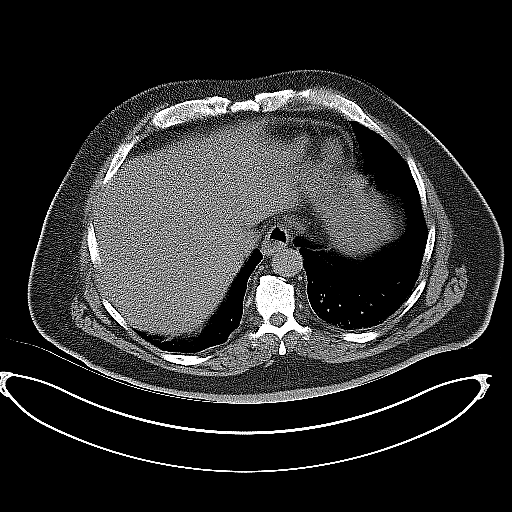
[im 21/33  bone]
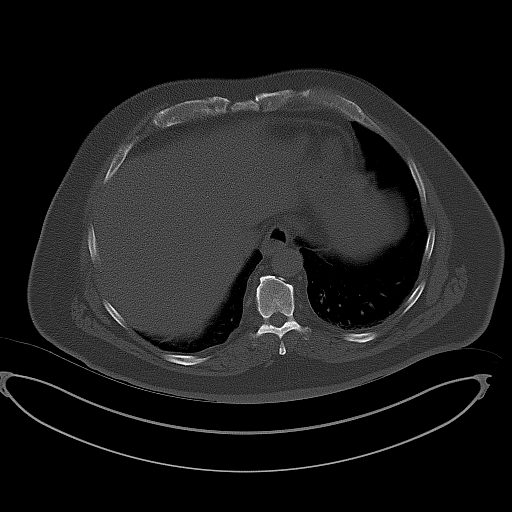
[im 23/33  soft-tissue]
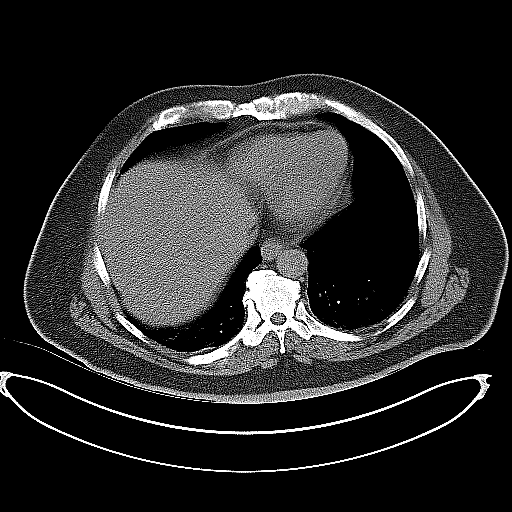
[im 26/33  soft-tissue]
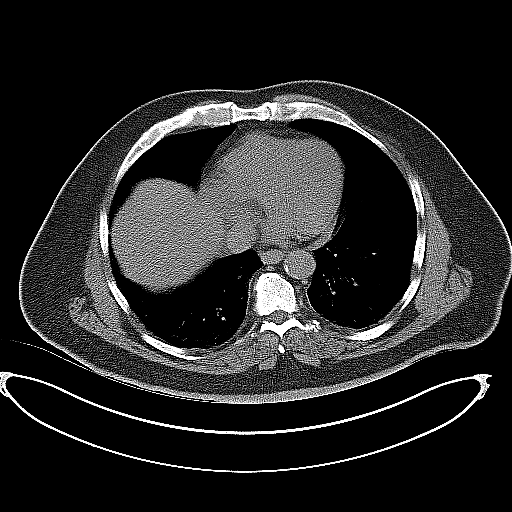
[im 28/33  soft-tissue]
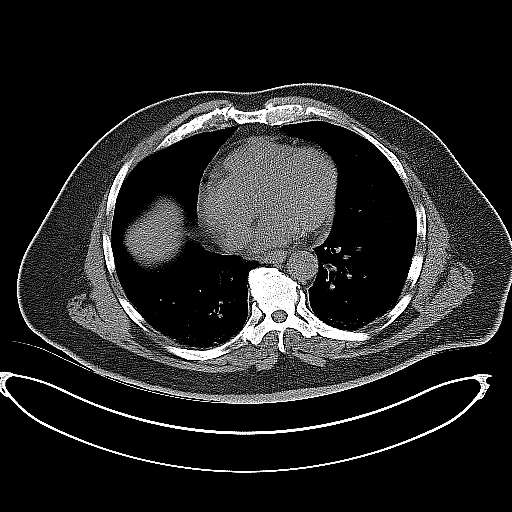
[im 30/33  soft-tissue]
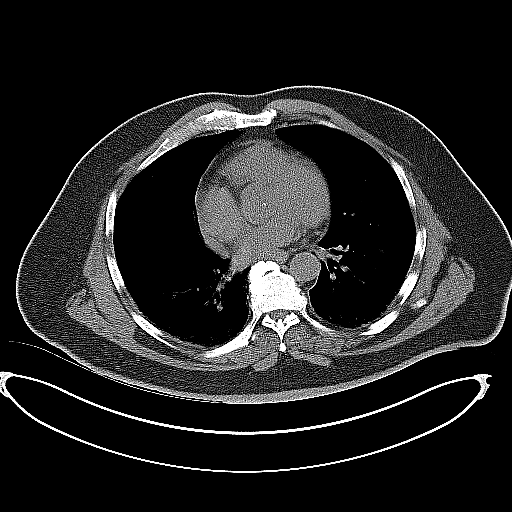

[16 of 46 positions shown; findings below may reference images not displayed]

FINDINGS: Minimal bibasilar atelectasis is noted. Scattered coronary artery
calcifications are seen.

A 1.3 cm hypodensity is noted within the right hepatic lobe. A more
vague 1.1 cm hypodensity is noted at the left hepatic lobe. The
gallbladder is within normal limits. A 2.8 cm left adrenal mass
demonstrates attenuation compatible with an adrenal adenoma. The
right adrenal gland is unremarkable in appearance. The pancreas is
within normal limits.

Mild nonspecific perinephric stranding is noted bilaterally. A
cm cyst is noted at the medial aspect of the right kidney. The
kidneys are otherwise unremarkable. There is no evidence of
hydronephrosis. No renal or ureteral stones are seen.

No free fluid is identified. The small bowel is unremarkable in
appearance. The stomach is within normal limits. No acute vascular
abnormalities are seen. Scattered calcification is noted along the
abdominal aorta and proximal renal arteries bilaterally.

The appendix is normal in caliber and contains air, without evidence
of appendicitis. The colon is unremarkable in appearance.

The bladder is decompressed, with a Foley catheter in place. Mild
apparent bladder wall thickening may simply reflect relative
decompression, though mild cystitis could have a similar appearance.
The prostate is enlarged, with mass effect on the base of the
bladder, measuring 5.9 cm in transverse dimension. No inguinal
lymphadenopathy is seen.

No acute osseous abnormalities are identified. Anterior osteophytes
are seen along the lumbar spine. Chronic irregularity is noted along
the superior endplate of L5.
IMPRESSION: 1. Mild apparent bladder wall thickening may simply reflect relative
decompression, though mild cystitis could have a similar appearance.
2. Enlarged prostate demonstrates mass effect on the base of the
bladder, measuring 5.9 cm in transverse dimension. This could be
related to the patient's urinary retention.
3. Nonspecific small hypodensities within the liver, measuring up to
1.3 cm. Would correlate with LFTs, as deemed clinically appropriate.
4. 2.8 cm left adrenal adenoma incidentally noted.
5. Small right renal cyst seen.
6. Scattered coronary artery calcifications noted.
7. Scattered calcification along the abdominal aorta and proximal
renal arteries bilaterally.
